# Patient Record
Sex: Female | Born: 1945 | Race: White | Hispanic: No | Marital: Single | Smoking: Never smoker
Health system: Southern US, Community
[De-identification: ages and names within clinical notes are randomized; demographics above are authoritative.]

## PROBLEM LIST (undated history)

## (undated) DIAGNOSIS — Z7189 Other specified counseling: Secondary | ICD-10-CM

## (undated) DIAGNOSIS — F329 Major depressive disorder, single episode, unspecified: Secondary | ICD-10-CM

## (undated) DIAGNOSIS — C4362 Malignant melanoma of left upper limb, including shoulder: Secondary | ICD-10-CM

## (undated) DIAGNOSIS — F32A Depression, unspecified: Secondary | ICD-10-CM

## (undated) DIAGNOSIS — I10 Essential (primary) hypertension: Secondary | ICD-10-CM

## (undated) HISTORY — DX: Malignant melanoma of left upper limb, including shoulder: C43.62

## (undated) HISTORY — DX: Other specified counseling: Z71.89

---
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
## 2017-12-02 ENCOUNTER — Inpatient Hospital Stay: Payer: Medicare HMO

## 2017-12-02 ENCOUNTER — Other Ambulatory Visit: Payer: Self-pay

## 2017-12-02 ENCOUNTER — Inpatient Hospital Stay: Payer: Medicare HMO | Attending: Hematology & Oncology | Admitting: Hematology & Oncology

## 2017-12-02 ENCOUNTER — Encounter: Payer: Self-pay | Admitting: Hematology & Oncology

## 2017-12-02 VITALS — BP 160/75 | HR 83 | Temp 98.2°F | Resp 16 | Ht 66.5 in | Wt 257.0 lb

## 2017-12-02 DIAGNOSIS — C792 Secondary malignant neoplasm of skin: Secondary | ICD-10-CM | POA: Diagnosis not present

## 2017-12-02 DIAGNOSIS — Z7189 Other specified counseling: Secondary | ICD-10-CM

## 2017-12-02 DIAGNOSIS — C4362 Malignant melanoma of left upper limb, including shoulder: Secondary | ICD-10-CM | POA: Diagnosis not present

## 2017-12-02 DIAGNOSIS — Z85038 Personal history of other malignant neoplasm of large intestine: Secondary | ICD-10-CM | POA: Insufficient documentation

## 2017-12-02 DIAGNOSIS — Z5112 Encounter for antineoplastic immunotherapy: Secondary | ICD-10-CM | POA: Diagnosis present

## 2017-12-02 HISTORY — DX: Malignant melanoma of left upper limb, including shoulder: C43.62

## 2017-12-02 HISTORY — DX: Other specified counseling: Z71.89

## 2017-12-02 LAB — CMP (CANCER CENTER ONLY)
ALT: 15 U/L (ref 0–55)
AST: 23 U/L (ref 5–34)
Albumin: 3.9 g/dL (ref 3.5–5.0)
Alkaline Phosphatase: 75 U/L (ref 40–150)
Anion gap: 10 (ref 3–11)
BUN: 20 mg/dL (ref 7–26)
CHLORIDE: 106 mmol/L (ref 98–109)
CO2: 24 mmol/L (ref 22–29)
CREATININE: 0.97 mg/dL (ref 0.60–1.10)
Calcium: 9.7 mg/dL (ref 8.4–10.4)
GFR, EST NON AFRICAN AMERICAN: 57 mL/min — AB (ref >60)
GFR, Est AFR Am: >60 mL/min (ref >60)
GLUCOSE: 91 mg/dL (ref 70–140)
Potassium: 4.2 mmol/L (ref 3.5–5.1)
Sodium: 140 mmol/L (ref 136–145)
Total Bilirubin: 0.4 mg/dL (ref 0.2–1.2)
Total Protein: 7.5 g/dL (ref 6.4–8.3)

## 2017-12-02 LAB — CBC WITH DIFFERENTIAL (CANCER CENTER ONLY)
Basophils Absolute: 0 10*3/uL (ref 0.0–0.1)
Basophils Relative: 0 %
EOS ABS: 0.2 10*3/uL (ref 0.0–0.5)
EOS PCT: 2 %
HCT: 38.7 % (ref 34.8–46.6)
Hemoglobin: 12.7 g/dL (ref 11.6–15.9)
LYMPHS ABS: 3.8 10*3/uL — AB (ref 0.9–3.3)
Lymphocytes Relative: 38 %
MCH: 27.2 pg (ref 26.0–34.0)
MCHC: 32.8 g/dL (ref 32.0–36.0)
MCV: 82.9 fL (ref 81.0–101.0)
MONO ABS: 0.8 10*3/uL (ref 0.1–0.9)
MONOS PCT: 8 %
Neutro Abs: 5.1 10*3/uL (ref 1.5–6.5)
Neutrophils Relative %: 52 %
PLATELETS: 273 10*3/uL (ref 145–400)
RBC: 4.67 MIL/uL (ref 3.70–5.32)
RDW: 15.3 % (ref 11.1–15.7)
WBC: 9.9 10*3/uL (ref 3.9–10.0)

## 2017-12-02 LAB — LACTATE DEHYDROGENASE: LDH: 188 U/L (ref 125–245)

## 2017-12-02 NOTE — Progress Notes (Signed)
START ON PATHWAY REGIMEN - Melanoma     A cycle is every 28 days:     Nivolumab   **Always confirm dose/schedule in your pharmacy ordering system**  Patient Characteristics: Stage III, Resected, BRAF V600 Wild Type / BRAF V600 Results Pending or Unknown Disease Subtype: Cutaneous Current Disease Status: No Recurrence and No Distant Metastases AJCC 8 Stage Grouping: III AJCC T Category: cT3 AJCC N Category: N1 AJCC M Category: M0 Mutation Status: Awaiting BRAF V600 Results Intent of Therapy: Curative Intent, Discussed with Patient

## 2017-12-02 NOTE — Progress Notes (Signed)
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 New Patient (Initial Visit)  : I had melanoma taken off.  HPI: Brittany Williamson is a very charming 72 year old white female.  She will be 72 and 11 days.  She has been in decent health.  She did have her gallbladder taken out many years ago.  She used to work for [employer] of [place].  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  She had noted a lesion on her left shoulder.  This appeared to be getting larger and darker over several months.  She has seen a dermatologist who was not too worried.  As it kept growing, she went to see a different dermatologist.  She subsequently had a biopsy done.  This was done in April.  The pathology report showed a melanoma that was 9 mm in size.  I think it had a breast low depth of 2.88 mm.  She was then referred to Dr. Zeb Comfort.  He went ahead and did a wide local excision and a left axillary node excision.  This was done on Nov 05, 2017.  The pathology report ([id number]) showed no residual melanoma.  1 of 2 sentinel lymph nodes were positive.  As such, she has a stage IIIa melanoma.  No other studies have been done as of yet.  She was referred to the [facility] and evaluation for adjuvant therapy.  She is quite nervous.  She comes in with her husband.  He had colon cancer that was resected and treated with adjuvant therapy about 7 years ago.  She does not know of any sunburns.  She has been to tanning beds but has not been for quite a while.  She does not smoke.  She really does not drink alcohol.  No one in her family has any skin cancer.  There is been no fever.  She is had no cough.  She said no swelling in her arms.  She has had no change in bowel or bladder habits.  Her last colonoscopy was about 5-6 years ago.  She gets her yearly mammograms.  Currently, her performance status is ECOG  0.   Past Medical History:  Diagnosis Date  . Goals of care, counseling/discussion 12/02/2017  . Malignant melanoma of arm, left ([facility]) 12/02/2017  :  History reviewed. No pertinent surgical history.:   Current Outpatient Medications:  .  atorvastatin (LIPITOR) 20 MG tablet, Take 20 mg by mouth daily., Disp: , Rfl:  .  carvedilol (COREG) 3.125 MG tablet, Take 3.125 mg by mouth 2 (two) times daily., Disp: , Rfl:  .  valsartan-hydrochlorothiazide (DIOVAN-HCT) 80-12.5 MG tablet, , Disp: , Rfl:  .  DULoxetine (CYMBALTA) 30 MG capsule, Take 30 mg by mouth daily., Disp: , Rfl: :  :  Allergies  Allergen Reactions  . Bee Venom Other (See Comments)  . Penicillins Rash  . Beeswax Other (See Comments)    Bee stings. Bee stings.   :  History reviewed. No pertinent family history.:  Social History   Socioeconomic History  . Marital status: Unknown    Spouse name: Not on file  . Number of children: Not on file  . Years of education: Not on file  . Highest education level: Not on file  Occupational History  . Not on file  Social Needs  . Financial resource strain: Not on file  . Food  insecurity:    Worry: Not on file    Inability: Not on file  . Transportation needs:    Medical: Not on file    Non-medical: Not on file  Tobacco Use  . Smoking status: Not on file  Substance and Sexual Activity  . Alcohol use: Not on file  . Drug use: Not on file  . Sexual activity: Not on file  Lifestyle  . Physical activity:    Days per week: Not on file    Minutes per session: Not on file  . Stress: Not on file  Relationships  . Social connections:    Talks on phone: Not on file    Gets together: Not on file    Attends religious service: Not on file    Active member of club or organization: Not on file    Attends meetings of clubs or organizations: Not on file    Relationship status: Not on file  . Intimate partner violence:    Fear of current or ex partner: Not on file     Emotionally abused: Not on file    Physically abused: Not on file    Forced sexual activity: Not on file  Other Topics Concern  . Not on file  Social History Narrative  . Not on file  :  Review of Systems  Constitutional: Negative.   HENT: Negative.   Eyes: Negative.   Respiratory: Negative.   Cardiovascular: Negative.   Gastrointestinal: Negative.   Genitourinary: Negative.   Musculoskeletal: Negative.   Skin: Negative.   Neurological: Negative.   Endo/Heme/Allergies: Negative.   Psychiatric/Behavioral: Negative.      Exam: Moderately obese white female in no obvious distress.  Her vital signs show a temperature of 98.2.  Pulse 83.  Blood pressure 160/75.  Weight is 257 pounds.  Head neck exam shows no ocular or oral lesions.  There are no palpable cervical or supraclavicular lymph nodes.  Lungs are clear bilaterally.  Cardiac exam regular rate and rhythm with no murmurs, rubs or bruits.  Abdomen is soft.  She has good bowel sounds.  There is no fluid wave.  There is no palpable liver or spleen tip.  She has laparoscopy scars from her cholecystectomy.  Back exam shows no tenderness over the spine, ribs or hips.  Extremities shows a wide local excision scar in the left anterior shoulder.  This is healing.  Axillary exam shows no bilateral axillary adenopathy.  She has the healing left sentinel node axillary lymphadenectomy scar.  Lower extremities shows no clubbing, cyanosis or edema.  Skin exam shows no rashes, ecchymoses or petechia.  No suspicious lesions are noted on her back.  Neurological exam shows no focal neurological deficits. _0 @   Recent Labs    12/02/17 1314  WBC 9.9  HGB 12.7  HCT 38.7  PLT 273   Recent Labs    12/02/17 1314  NA 140  K 4.2  CL 106  CO2 24  GLUCOSE 91  BUN 20  CREATININE 0.97  CALCIUM 9.7    Blood smear review: None  Pathology: See above    Assessment and Plan: Brittany Williamson is a very charming 72 year old white female with a  stage IIIa melanoma of the left shoulder.  This was resected.  She had one positive sentinel lymph node.  Brittany Williamson is in very good shape.  As such, she would be a great candidate for adjuvant immunotherapy.  I spent a good hour with she and her husband.  All the time was spent face-to-face with them.  I explained why adjuvant immunotherapy is recommended.  I think without any therapy, she has a significant risk of recurrence of this melanoma.  I do think we should get a PET scan just to make sure that there is no obvious metastatic disease.  We will use nivolumab.  We will treat her monthly.  I think with monthly nivolumab, we can avoid having to have a Port-A-Cath placed.  I did will need to get a BRAF analysis on her melanoma.  I do not think this was done as of yet.  I think would be helpful to get one.  Brittany Williamson has  to attend to some events that she had already paid for.  She has a convention that she has to go to.  Her birthday then comes up in a couple weeks.  We will get started with treatment at the end of June.  She will need 1 year of adjuvant therapy.  I went over the side effects of treatment.  I explained to her about diarrhea, skin rash, abnormal liver function test, low thyroid function, etc., she understands all this.  She agrees to proceed with therapy.  I answered all of her questions.  She is obviously very well versed and very knowledgeable.  It was fun talking with she and her husband.  I will plan to have him come back for the second cycle of treatment in July.

## 2017-12-03 ENCOUNTER — Telehealth: Payer: Self-pay | Admitting: *Deleted

## 2017-12-03 NOTE — Telephone Encounter (Signed)
Written request faxed to:  [facility] [phone]  Request for BRAF to be added to  Case [id number] Specimen #1 Collected 11/05/2017  Faxed receipt confirmed transmission

## 2017-12-16 ENCOUNTER — Telehealth: Payer: Self-pay | Admitting: *Deleted

## 2017-12-16 ENCOUNTER — Other Ambulatory Visit: Payer: Self-pay | Admitting: Hematology & Oncology

## 2017-12-16 ENCOUNTER — Encounter (HOSPITAL_COMMUNITY)
Admission: RE | Admit: 2017-12-16 | Discharge: 2017-12-16 | Disposition: A | Discharge status: Home / Self Care | Payer: Medicare HMO | Source: Ambulatory Visit | Attending: Hematology & Oncology | Admitting: Hematology & Oncology

## 2017-12-16 ENCOUNTER — Inpatient Hospital Stay: Payer: Medicare HMO

## 2017-12-16 DIAGNOSIS — C4362 Malignant melanoma of left upper limb, including shoulder: Secondary | ICD-10-CM | POA: Diagnosis not present

## 2017-12-16 LAB — GLUCOSE, CAPILLARY: Glucose-Capillary: 113 mg/dL — ABNORMAL HIGH (ref 65–99)

## 2017-12-16 MED ORDER — FLUDEOXYGLUCOSE F - 18 (FDG) INJECTION
12.9000 | Freq: Once | INTRAVENOUS | Status: AC | PRN
  Administered 2017-12-16: 12.9 via INTRAVENOUS

## 2017-12-16 NOTE — Telephone Encounter (Addendum)
Patient is aware of results  ----- Message from Volanda Napoleon, MD sent at 12/16/2017  1:24 PM EDT ----- Call - NO obvious melanoma elsewhere!!  [name]

## 2017-12-20 ENCOUNTER — Other Ambulatory Visit: Payer: Self-pay | Admitting: *Deleted

## 2017-12-20 DIAGNOSIS — C4362 Malignant melanoma of left upper limb, including shoulder: Secondary | ICD-10-CM

## 2017-12-23 ENCOUNTER — Inpatient Hospital Stay: Payer: Medicare HMO

## 2017-12-23 VITALS — BP 148/54 | HR 72 | Temp 97.6°F | Resp 18

## 2017-12-23 DIAGNOSIS — C4362 Malignant melanoma of left upper limb, including shoulder: Secondary | ICD-10-CM

## 2017-12-23 DIAGNOSIS — Z5112 Encounter for antineoplastic immunotherapy: Secondary | ICD-10-CM | POA: Diagnosis not present

## 2017-12-23 LAB — CBC WITH DIFFERENTIAL (CANCER CENTER ONLY)
Basophils Absolute: 0 10*3/uL (ref 0.0–0.1)
Basophils Relative: 0 %
EOS ABS: 0.1 10*3/uL (ref 0.0–0.5)
Eosinophils Relative: 2 %
HCT: 38.1 % (ref 34.8–46.6)
HEMOGLOBIN: 12.5 g/dL (ref 11.6–15.9)
LYMPHS ABS: 2.9 10*3/uL (ref 0.9–3.3)
Lymphocytes Relative: 41 %
MCH: 27.2 pg (ref 26.0–34.0)
MCHC: 32.8 g/dL (ref 32.0–36.0)
MCV: 83 fL (ref 81.0–101.0)
MONO ABS: 0.5 10*3/uL (ref 0.1–0.9)
Monocytes Relative: 7 %
Neutro Abs: 3.6 10*3/uL (ref 1.5–6.5)
Neutrophils Relative %: 50 %
Platelet Count: 261 10*3/uL (ref 145–400)
RBC: 4.59 MIL/uL (ref 3.70–5.32)
RDW: 15.1 % (ref 11.1–15.7)
WBC Count: 7.1 10*3/uL (ref 3.9–10.0)

## 2017-12-23 LAB — CMP (CANCER CENTER ONLY)
ALK PHOS: 72 U/L (ref 26–84)
ALT: 29 U/L (ref 10–47)
AST: 24 U/L (ref 11–38)
Albumin: 3.6 g/dL (ref 3.5–5.0)
Anion gap: 12 (ref 5–15)
BUN: 19 mg/dL (ref 7–22)
CHLORIDE: 106 mmol/L (ref 98–108)
CO2: 25 mmol/L (ref 18–33)
Calcium: 9.2 mg/dL (ref 8.0–10.3)
Creatinine: 1 mg/dL (ref 0.60–1.20)
Glucose, Bld: 111 mg/dL (ref 73–118)
POTASSIUM: 3.8 mmol/L (ref 3.3–4.7)
SODIUM: 143 mmol/L (ref 128–145)
Total Bilirubin: 0.7 mg/dL (ref 0.2–1.6)
Total Protein: 7.2 g/dL (ref 6.4–8.1)

## 2017-12-23 MED ORDER — ACETAMINOPHEN 325 MG PO TABS
ORAL_TABLET | ORAL | Status: AC
  Filled 2017-12-23: qty 2

## 2017-12-23 MED ORDER — SODIUM CHLORIDE 0.9 % IV SOLN
480.0000 mg | Freq: Once | INTRAVENOUS | Status: AC
  Administered 2017-12-23: 480 mg via INTRAVENOUS
  Filled 2017-12-23: qty 48

## 2017-12-23 MED ORDER — SODIUM CHLORIDE 0.9 % IV SOLN
Freq: Once | INTRAVENOUS | Status: AC
  Administered 2017-12-23: 10:00:00 via INTRAVENOUS

## 2017-12-23 NOTE — Patient Instructions (Signed)
Nivolumab injection What is this medicine? NIVOLUMAB (nye VOL ue mab) is a monoclonal antibody. It is used to treat melanoma, lung cancer, kidney cancer, head and neck cancer, Hodgkin lymphoma, urothelial cancer, colon cancer, and liver cancer. This medicine may be used for other purposes; ask your health care provider or pharmacist if you have questions. COMMON BRAND NAME(S): Opdivo What should I tell my health care provider before I take this medicine? They need to know if you have any of these conditions: -diabetes -immune system problems -kidney disease -liver disease -lung disease -organ transplant -stomach or intestine problems -thyroid disease -an unusual or allergic reaction to nivolumab, other medicines, foods, dyes, or preservatives -pregnant or trying to get pregnant -breast-feeding How should I use this medicine? This medicine is for infusion into a vein. It is given by a health care professional in a hospital or clinic setting. A special MedGuide will be given to you before each treatment. Be sure to read this information carefully each time. Talk to your pediatrician regarding the use of this medicine in children. While this drug may be prescribed for children as young as 12 years for selected conditions, precautions do apply. Overdosage: If you think you have taken too much of this medicine contact a poison control center or emergency room at once. NOTE: This medicine is only for you. Do not share this medicine with others. What if I miss a dose? It is important not to miss your dose. Call your doctor or health care professional if you are unable to keep an appointment. What may interact with this medicine? Interactions have not been studied. Give your health care provider a list of all the medicines, herbs, non-prescription drugs, or dietary supplements you use. Also tell them if you smoke, drink alcohol, or use illegal drugs. Some items may interact with your  medicine. This list may not describe all possible interactions. Give your health care provider a list of all the medicines, herbs, non-prescription drugs, or dietary supplements you use. Also tell them if you smoke, drink alcohol, or use illegal drugs. Some items may interact with your medicine. What should I watch for while using this medicine? This drug may make you feel generally unwell. Continue your course of treatment even though you feel ill unless your doctor tells you to stop. You may need blood work done while you are taking this medicine. Do not become pregnant while taking this medicine or for 5 months after stopping it. Women should inform their doctor if they wish to become pregnant or think they might be pregnant. There is a potential for serious side effects to an unborn child. Talk to your health care professional or pharmacist for more information. Do not breast-feed an infant while taking this medicine. What side effects may I notice from receiving this medicine? Side effects that you should report to your doctor or health care professional as soon as possible: -allergic reactions like skin rash, itching or hives, swelling of the face, lips, or tongue -black, tarry stools -blood in the urine -bloody or watery diarrhea -changes in vision -change in sex drive -changes in emotions or moods -chest pain -confusion -cough -decreased appetite -diarrhea -facial flushing -feeling faint or lightheaded -fever, chills -hair loss -hallucination, loss of contact with reality -headache -irritable -joint pain -loss of memory -muscle pain -muscle weakness -seizures -shortness of breath -signs and symptoms of high blood sugar such as dizziness; dry mouth; dry skin; fruity breath; nausea; stomach pain; increased hunger or thirst; increased   urination -signs and symptoms of kidney injury like trouble passing urine or change in the amount of urine -signs and symptoms of liver injury  like dark yellow or brown urine; general ill feeling or flu-like symptoms; light-colored stools; loss of appetite; nausea; right upper belly pain; unusually weak or tired; yellowing of the eyes or skin -stiff neck -swelling of the ankles, feet, hands -weight gain Side effects that usually do not require medical attention (report to your doctor or health care professional if they continue or are bothersome): -bone pain -constipation -tiredness -vomiting This list may not describe all possible side effects. Call your doctor for medical advice about side effects. You may report side effects to FDA at 1-800-FDA-1088. Where should I keep my medicine? This drug is given in a hospital or clinic and will not be stored at home. NOTE: This sheet is a summary. It may not cover all possible information. If you have questions about this medicine, talk to your doctor, pharmacist, or health care provider.  2018 Elsevier/Gold Standard (2016-03-26 17:49:34)  

## 2018-01-16 ENCOUNTER — Other Ambulatory Visit: Payer: Self-pay

## 2018-01-16 DIAGNOSIS — C4362 Malignant melanoma of left upper limb, including shoulder: Secondary | ICD-10-CM

## 2018-01-17 ENCOUNTER — Inpatient Hospital Stay: Payer: Medicare HMO | Attending: Hematology & Oncology

## 2018-01-17 ENCOUNTER — Other Ambulatory Visit: Payer: Medicare HMO

## 2018-01-17 DIAGNOSIS — C4362 Malignant melanoma of left upper limb, including shoulder: Secondary | ICD-10-CM | POA: Insufficient documentation

## 2018-01-17 DIAGNOSIS — Z79899 Other long term (current) drug therapy: Secondary | ICD-10-CM | POA: Diagnosis not present

## 2018-01-17 DIAGNOSIS — C792 Secondary malignant neoplasm of skin: Secondary | ICD-10-CM | POA: Insufficient documentation

## 2018-01-17 DIAGNOSIS — Z5112 Encounter for antineoplastic immunotherapy: Secondary | ICD-10-CM | POA: Diagnosis present

## 2018-01-17 LAB — CBC WITH DIFFERENTIAL (CANCER CENTER ONLY)
Basophils Absolute: 0 10*3/uL (ref 0.0–0.1)
Basophils Relative: 0 %
EOS ABS: 0.2 10*3/uL (ref 0.0–0.5)
EOS PCT: 2 %
HCT: 39.8 % (ref 34.8–46.6)
Hemoglobin: 13.1 g/dL (ref 11.6–15.9)
LYMPHS ABS: 2.9 10*3/uL (ref 0.9–3.3)
LYMPHS PCT: 40 %
MCH: 27 pg (ref 26.0–34.0)
MCHC: 32.9 g/dL (ref 32.0–36.0)
MCV: 82.1 fL (ref 81.0–101.0)
MONO ABS: 0.6 10*3/uL (ref 0.1–0.9)
Monocytes Relative: 8 %
Neutro Abs: 3.5 10*3/uL (ref 1.5–6.5)
Neutrophils Relative %: 50 %
PLATELETS: 245 10*3/uL (ref 145–400)
RBC: 4.85 MIL/uL (ref 3.70–5.32)
RDW: 14.9 % (ref 11.1–15.7)
WBC Count: 7.2 10*3/uL (ref 3.9–10.0)

## 2018-01-17 LAB — CMP (CANCER CENTER ONLY)
ALT: 18 U/L (ref 0–44)
ANION GAP: 11 (ref 5–15)
AST: 18 U/L (ref 15–41)
Albumin: 4 g/dL (ref 3.5–5.0)
Alkaline Phosphatase: 77 U/L (ref 38–126)
BUN: 20 mg/dL (ref 8–23)
CHLORIDE: 105 mmol/L (ref 98–111)
CO2: 23 mmol/L (ref 22–32)
Calcium: 9.5 mg/dL (ref 8.9–10.3)
Creatinine: 0.8 mg/dL (ref 0.44–1.00)
Glucose, Bld: 120 mg/dL — ABNORMAL HIGH (ref 70–99)
POTASSIUM: 3.9 mmol/L (ref 3.5–5.1)
SODIUM: 139 mmol/L (ref 135–145)
Total Bilirubin: 0.4 mg/dL (ref 0.3–1.2)
Total Protein: 7.6 g/dL (ref 6.5–8.1)

## 2018-01-17 LAB — TSH: TSH: 2.113 u[IU]/mL (ref 0.308–3.960)

## 2018-01-17 LAB — LACTATE DEHYDROGENASE: LDH: 194 U/L — ABNORMAL HIGH (ref 98–192)

## 2018-01-20 ENCOUNTER — Other Ambulatory Visit: Payer: Medicare HMO

## 2018-01-20 ENCOUNTER — Inpatient Hospital Stay: Payer: Medicare HMO

## 2018-01-20 ENCOUNTER — Encounter: Payer: Self-pay | Admitting: Family

## 2018-01-20 ENCOUNTER — Inpatient Hospital Stay: Payer: Medicare HMO | Admitting: Family

## 2018-01-20 ENCOUNTER — Other Ambulatory Visit: Payer: Self-pay

## 2018-01-20 VITALS — BP 156/62 | HR 87 | Temp 98.0°F | Resp 19 | Wt 254.0 lb

## 2018-01-20 DIAGNOSIS — C4362 Malignant melanoma of left upper limb, including shoulder: Secondary | ICD-10-CM

## 2018-01-20 DIAGNOSIS — C792 Secondary malignant neoplasm of skin: Secondary | ICD-10-CM | POA: Diagnosis not present

## 2018-01-20 DIAGNOSIS — R350 Frequency of micturition: Secondary | ICD-10-CM | POA: Diagnosis not present

## 2018-01-20 DIAGNOSIS — R21 Rash and other nonspecific skin eruption: Secondary | ICD-10-CM

## 2018-01-20 DIAGNOSIS — R32 Unspecified urinary incontinence: Secondary | ICD-10-CM | POA: Diagnosis not present

## 2018-01-20 DIAGNOSIS — Z5112 Encounter for antineoplastic immunotherapy: Secondary | ICD-10-CM | POA: Diagnosis not present

## 2018-01-20 DIAGNOSIS — E032 Hypothyroidism due to medicaments and other exogenous substances: Secondary | ICD-10-CM

## 2018-01-20 MED ORDER — SODIUM CHLORIDE 0.9 % IV SOLN
480.0000 mg | Freq: Once | INTRAVENOUS | Status: AC
  Administered 2018-01-20: 480 mg via INTRAVENOUS
  Filled 2018-01-20: qty 48

## 2018-01-20 MED ORDER — SODIUM CHLORIDE 0.9 % IV SOLN
Freq: Once | INTRAVENOUS | Status: AC
  Administered 2018-01-20: 11:00:00 via INTRAVENOUS

## 2018-01-20 NOTE — Progress Notes (Signed)
Hematology and Oncology Follow Up Visit  Brittany Williamson [id number] 1945-09-18 72 y.o. 01/20/2018   Principle Diagnosis:  Stage IIIa (T2N1M0) nodular melanoma of the left shoulder  Current Therapy:   Wide local excision and left axillary node excision on 11/05/2017 Opdivo q 28 days s/p cycle 1   Interim History:  Brittany Williamson is here today for follow-up and cycle 2 of Opdivo. She tolerated her first cycle well but has noticed some fatigue. Her left shoulder and left axillary incision sites have healed nicely. No redness, edema or drainage to indicate infection.  No fever, chills, n/v, cough, dizziness, SOB, chest pain, palpitations or abdominal pain.  She had a few episodes of loose stools after her first cycle. She describes this as tolerable.  The puffiness in her feet and ankles waxes and wanes. No numbness or tingling in her extremities.  She will occasionally itch all over but has not seen a rash. She takes allergy medicine and it clears up.  No bleeding, bruising or petechiae. No lymphadenopathy noted on her exam.  She has a good appetite and is staying well hydrated. Her weight is stable.  Her legs have felt weak and she uses a cane or walker as needed for support when ambulating. She has not had any falls or syncopal episodes.  She states that due to hydrating well she now has urinary urgency/frequency and incontinence at times. She is wearing adult diapers and has noted some diaper rash.   ECOG Performance Status: 1 - Symptomatic but completely ambulatory  Medications:  Allergies as of 01/20/2018      Reactions   Bee Venom Other (See Comments)   Penicillins Rash   Beeswax Other (See Comments)   Bee stings. Bee stings.      Medication List        Accurate as of 01/20/18 10:04 AM. Always use your most recent med list.          atorvastatin 20 MG tablet Commonly known as:  LIPITOR Take 20 mg by mouth daily.   carvedilol 3.125 MG tablet Commonly known as:  COREG Take 3.125  mg by mouth 2 (two) times daily.   DULoxetine 30 MG capsule Commonly known as:  CYMBALTA Take 30 mg by mouth daily.   valsartan-hydrochlorothiazide 80-12.5 MG tablet Commonly known as:  DIOVAN-HCT       Allergies:  Allergies  Allergen Reactions  . Bee Venom Other (See Comments)  . Penicillins Rash  . Beeswax Other (See Comments)    Bee stings. Bee stings.     Past Medical History, Surgical history, Social history, and Family History were reviewed and updated.  Review of Systems: All other 10 point review of systems is negative.   Physical Exam:  vitals were not taken for this visit.   Wt Readings from Last 3 Encounters:  12/02/17 257 lb (116.6 kg)    Ocular: Sclerae unicteric, pupils equal, round and reactive to light Ear-nose-throat: Oropharynx clear, dentition fair Lymphatic: No cervical, supraclavicular or axillary adenopathy Lungs no rales or rhonchi, good excursion bilaterally Heart regular rate and rhythm, no murmur appreciated Abd soft, nontender, positive bowel sounds, no liver or spleen tip palpated on exam, no fluid wave MSK no focal spinal tenderness, no joint edema Neuro: non-focal, well-oriented, appropriate affect Breasts: Deferred   Lab Results  Component Value Date   WBC 7.2 01/17/2018   HGB 13.1 01/17/2018   HCT 39.8 01/17/2018   MCV 82.1 01/17/2018   PLT 245 01/17/2018   No  results found for: FERRITIN, IRON, TIBC, UIBC, IRONPCTSAT Lab Results  Component Value Date   RBC 4.85 01/17/2018   No results found for: KPAFRELGTCHN, LAMBDASER, KAPLAMBRATIO No results found for: IGGSERUM, IGA, IGMSERUM No results found for: Ronnald Ramp, A1GS, A2GS, Tillman Sers, SPEI   Chemistry      Component Value Date/Time   NA 139 01/17/2018 0745   K 3.9 01/17/2018 0745   CL 105 01/17/2018 0745   CO2 23 01/17/2018 0745   BUN 20 01/17/2018 0745   CREATININE 0.80 01/17/2018 0745      Component Value Date/Time   CALCIUM 9.5  01/17/2018 0745   ALKPHOS 77 01/17/2018 0745   AST 18 01/17/2018 0745   ALT 18 01/17/2018 0745   BILITOT 0.4 01/17/2018 0745      Impression and Plan: Brittany Williamson is a very pleasant 72 yo caucasian female with 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  She a wide local excision and left axillary node excision on 11/05/2017 and has completed her first cycle of Opdivo. She did well with treatment and has noted minimal side effects. TSH last week was stable.  We will proceed with cycle 2 today as planned.  We will have her try nystatin cream for rash from urinary frequency and incontinence.  We will plan to see her back in another month for follow-up on 02/14/18.  She will contact our office with any questions or concerns. We can certainly see her sooner if need be.   Laverna Peace, NP 7/22/201910:04 AM

## 2018-01-20 NOTE — Patient Instructions (Signed)
Nivolumab injection What is this medicine? NIVOLUMAB (nye VOL ue mab) is a monoclonal antibody. It is used to treat melanoma, lung cancer, kidney cancer, head and neck cancer, Hodgkin lymphoma, urothelial cancer, colon cancer, and liver cancer. This medicine may be used for other purposes; ask your health care provider or pharmacist if you have questions. COMMON BRAND NAME(S): Opdivo What should I tell my health care provider before I take this medicine? They need to know if you have any of these conditions: -diabetes -immune system problems -kidney disease -liver disease -lung disease -organ transplant -stomach or intestine problems -thyroid disease -an unusual or allergic reaction to nivolumab, other medicines, foods, dyes, or preservatives -pregnant or trying to get pregnant -breast-feeding How should I use this medicine? This medicine is for infusion into a vein. It is given by a health care professional in a hospital or clinic setting. A special MedGuide will be given to you before each treatment. Be sure to read this information carefully each time. Talk to your pediatrician regarding the use of this medicine in children. While this drug may be prescribed for children as young as 12 years for selected conditions, precautions do apply. Overdosage: If you think you have taken too much of this medicine contact a poison control center or emergency room at once. NOTE: This medicine is only for you. Do not share this medicine with others. What if I miss a dose? It is important not to miss your dose. Call your doctor or health care professional if you are unable to keep an appointment. What may interact with this medicine? Interactions have not been studied. Give your health care provider a list of all the medicines, herbs, non-prescription drugs, or dietary supplements you use. Also tell them if you smoke, drink alcohol, or use illegal drugs. Some items may interact with your  medicine. This list may not describe all possible interactions. Give your health care provider a list of all the medicines, herbs, non-prescription drugs, or dietary supplements you use. Also tell them if you smoke, drink alcohol, or use illegal drugs. Some items may interact with your medicine. What should I watch for while using this medicine? This drug may make you feel generally unwell. Continue your course of treatment even though you feel ill unless your doctor tells you to stop. You may need blood work done while you are taking this medicine. Do not become pregnant while taking this medicine or for 5 months after stopping it. Women should inform their doctor if they wish to become pregnant or think they might be pregnant. There is a potential for serious side effects to an unborn child. Talk to your health care professional or pharmacist for more information. Do not breast-feed an infant while taking this medicine. What side effects may I notice from receiving this medicine? Side effects that you should report to your doctor or health care professional as soon as possible: -allergic reactions like skin rash, itching or hives, swelling of the face, lips, or tongue -black, tarry stools -blood in the urine -bloody or watery diarrhea -changes in vision -change in sex drive -changes in emotions or moods -chest pain -confusion -cough -decreased appetite -diarrhea -facial flushing -feeling faint or lightheaded -fever, chills -hair loss -hallucination, loss of contact with reality -headache -irritable -joint pain -loss of memory -muscle pain -muscle weakness -seizures -shortness of breath -signs and symptoms of high blood sugar such as dizziness; dry mouth; dry skin; fruity breath; nausea; stomach pain; increased hunger or thirst; increased   urination -signs and symptoms of kidney injury like trouble passing urine or change in the amount of urine -signs and symptoms of liver injury  like dark yellow or brown urine; general ill feeling or flu-like symptoms; light-colored stools; loss of appetite; nausea; right upper belly pain; unusually weak or tired; yellowing of the eyes or skin -stiff neck -swelling of the ankles, feet, hands -weight gain Side effects that usually do not require medical attention (report to your doctor or health care professional if they continue or are bothersome): -bone pain -constipation -tiredness -vomiting This list may not describe all possible side effects. Call your doctor for medical advice about side effects. You may report side effects to FDA at 1-800-FDA-1088. Where should I keep my medicine? This drug is given in a hospital or clinic and will not be stored at home. NOTE: This sheet is a summary. It may not cover all possible information. If you have questions about this medicine, talk to your doctor, pharmacist, or health care provider.  2018 Elsevier/Gold Standard (2016-03-26 17:49:34)  

## 2018-01-21 ENCOUNTER — Other Ambulatory Visit: Payer: Self-pay | Admitting: Family

## 2018-01-21 DIAGNOSIS — C4362 Malignant melanoma of left upper limb, including shoulder: Secondary | ICD-10-CM

## 2018-01-21 DIAGNOSIS — B3789 Other sites of candidiasis: Secondary | ICD-10-CM

## 2018-01-21 DIAGNOSIS — B3731 Acute candidiasis of vulva and vagina: Secondary | ICD-10-CM

## 2018-01-21 DIAGNOSIS — B373 Candidiasis of vulva and vagina: Secondary | ICD-10-CM

## 2018-01-21 MED ORDER — NYSTATIN 100000 UNIT/GM EX CREA: 1.0000 "application " | TOPICAL_CREAM | Freq: Two times a day (BID) | CUTANEOUS | 0 refills | Status: AC

## 2018-02-14 ENCOUNTER — Inpatient Hospital Stay: Payer: Medicare HMO | Attending: Hematology & Oncology

## 2018-02-14 DIAGNOSIS — F329 Major depressive disorder, single episode, unspecified: Secondary | ICD-10-CM | POA: Insufficient documentation

## 2018-02-14 DIAGNOSIS — C4362 Malignant melanoma of left upper limb, including shoulder: Secondary | ICD-10-CM | POA: Insufficient documentation

## 2018-02-14 DIAGNOSIS — C792 Secondary malignant neoplasm of skin: Secondary | ICD-10-CM | POA: Insufficient documentation

## 2018-02-14 DIAGNOSIS — Z5112 Encounter for antineoplastic immunotherapy: Secondary | ICD-10-CM | POA: Insufficient documentation

## 2018-02-14 DIAGNOSIS — Z79899 Other long term (current) drug therapy: Secondary | ICD-10-CM | POA: Insufficient documentation

## 2018-02-14 DIAGNOSIS — E032 Hypothyroidism due to medicaments and other exogenous substances: Secondary | ICD-10-CM

## 2018-02-14 LAB — CBC WITH DIFFERENTIAL (CANCER CENTER ONLY)
BASOS PCT: 0 %
Basophils Absolute: 0 10*3/uL (ref 0.0–0.1)
Eosinophils Absolute: 0.2 10*3/uL (ref 0.0–0.5)
Eosinophils Relative: 2 %
HEMATOCRIT: 39.7 % (ref 34.8–46.6)
Hemoglobin: 13.1 g/dL (ref 11.6–15.9)
Lymphocytes Relative: 38 %
Lymphs Abs: 3.6 10*3/uL — ABNORMAL HIGH (ref 0.9–3.3)
MCH: 27.2 pg (ref 26.0–34.0)
MCHC: 33 g/dL (ref 32.0–36.0)
MCV: 82.5 fL (ref 81.0–101.0)
MONO ABS: 0.7 10*3/uL (ref 0.1–0.9)
MONOS PCT: 7 %
NEUTROS ABS: 4.9 10*3/uL (ref 1.5–6.5)
Neutrophils Relative %: 53 %
Platelet Count: 295 10*3/uL (ref 145–400)
RBC: 4.81 MIL/uL (ref 3.70–5.32)
RDW: 14.9 % (ref 11.1–15.7)
WBC Count: 9.3 10*3/uL (ref 3.9–10.0)

## 2018-02-14 LAB — CMP (CANCER CENTER ONLY)
ALBUMIN: 4 g/dL (ref 3.5–5.0)
ALT: 19 U/L (ref 0–44)
ANION GAP: 10 (ref 5–15)
AST: 20 U/L (ref 15–41)
Alkaline Phosphatase: 94 U/L (ref 38–126)
BILIRUBIN TOTAL: 0.7 mg/dL (ref 0.3–1.2)
BUN: 18 mg/dL (ref 8–23)
CO2: 25 mmol/L (ref 22–32)
Calcium: 9.4 mg/dL (ref 8.9–10.3)
Chloride: 101 mmol/L (ref 98–111)
Creatinine: 0.87 mg/dL (ref 0.44–1.00)
GFR, Est AFR Am: >60 mL/min (ref >60)
GFR, Estimated: >60 mL/min (ref >60)
GLUCOSE: 100 mg/dL — AB (ref 70–99)
Potassium: 3.7 mmol/L (ref 3.5–5.1)
Sodium: 136 mmol/L (ref 135–145)
TOTAL PROTEIN: 7.9 g/dL (ref 6.5–8.1)

## 2018-02-14 LAB — LACTATE DEHYDROGENASE: LDH: 175 U/L (ref 98–192)

## 2018-02-17 ENCOUNTER — Inpatient Hospital Stay: Payer: Medicare HMO

## 2018-02-17 ENCOUNTER — Inpatient Hospital Stay: Payer: Medicare HMO | Admitting: Hematology & Oncology

## 2018-02-17 ENCOUNTER — Other Ambulatory Visit: Payer: Self-pay | Admitting: Family

## 2018-02-17 ENCOUNTER — Other Ambulatory Visit: Payer: Self-pay

## 2018-02-17 VITALS — BP 147/56 | HR 91 | Temp 97.8°F | Resp 18 | Wt 252.2 lb

## 2018-02-17 DIAGNOSIS — C792 Secondary malignant neoplasm of skin: Secondary | ICD-10-CM

## 2018-02-17 DIAGNOSIS — C4362 Malignant melanoma of left upper limb, including shoulder: Secondary | ICD-10-CM | POA: Diagnosis not present

## 2018-02-17 DIAGNOSIS — F329 Major depressive disorder, single episode, unspecified: Secondary | ICD-10-CM | POA: Diagnosis not present

## 2018-02-17 LAB — TSH: TSH: 1.228 u[IU]/mL (ref 0.308–3.960)

## 2018-02-17 MED ORDER — SODIUM CHLORIDE 0.9 % IV SOLN
480.0000 mg | Freq: Once | INTRAVENOUS | Status: AC
  Administered 2018-02-17: 480 mg via INTRAVENOUS
  Filled 2018-02-17: qty 48

## 2018-02-17 MED ORDER — SODIUM CHLORIDE 0.9 % IV SOLN
Freq: Once | INTRAVENOUS | Status: AC
  Administered 2018-02-17: 12:00:00 via INTRAVENOUS
  Filled 2018-02-17: qty 250

## 2018-02-17 NOTE — Progress Notes (Signed)
Hematology and Oncology Follow Up Visit  Brittany Williamson [id number] 09/12/1945 71 y.o. 02/17/2018   Principle Diagnosis:  Stage IIIa (T2N1M0) nodular melanoma of the left shoulder  Current Therapy:   Wide local excision and left axillary node excision on 11/05/2017 Opdivo q 28 days s/p cycle #2   Interim History:  Brittany Williamson is here today for follow-up.  She is quite happy right now.  However, she also has an element of depression.  She has had depression for quite a while.  She is on Cymbalta but only at 30 mg a day.  I told her to try doubling this to see if this can help with her depression.  She is quite excited that she would be going down to [place], [place] this weekend.  Twin granddaughters that she is never seen before live in [place] will be coming down.  She really is looking forward to this.  She is truly blessed and that she has so many grandkids and great grandchildren.  The nivolumab is doing pretty well for her.  She is getting by with out having any diarrhea.  She is having no nausea or vomiting.  She is had no rashes.  She is had no leg swelling.  Overall, her performance status is ECOG 1.    Medications:  Allergies as of 02/17/2018      Reactions   Bee Venom Other (See Comments)   Penicillins Rash   Beeswax Other (See Comments)   Bee stings. Bee stings. Bee stings.      Medication List        Accurate as of 02/17/18 10:45 AM. Always use your most recent med list.          atorvastatin 20 MG tablet Commonly known as:  LIPITOR Take 20 mg by mouth daily.   carvedilol 3.125 MG tablet Commonly known as:  COREG Take 3.125 mg by mouth 2 (two) times daily.   DULoxetine 30 MG capsule Commonly known as:  CYMBALTA Take 30 mg by mouth daily.   nystatin cream Commonly known as:  MYCOSTATIN Apply 1 application topically 2 (two) times daily.   valsartan-hydrochlorothiazide 80-12.5 MG tablet Commonly known as:  DIOVAN-HCT       Allergies:    Allergies  Allergen Reactions  . Bee Venom Other (See Comments)  . Penicillins Rash  . Beeswax Other (See Comments)    Bee stings. Bee stings.  Bee stings.    Past Medical History, Surgical history, Social history, and Family History were reviewed and updated.  Review of Systems: Review of Systems  Constitutional: Negative.   HENT: Negative.   Eyes: Negative.   Respiratory: Negative.   Cardiovascular: Negative.   Gastrointestinal: Negative.   Genitourinary: Negative.   Musculoskeletal: Negative.   Skin: Negative.   Neurological: Negative.   Endo/Heme/Allergies: Negative.   Psychiatric/Behavioral: Positive for depression.     Physical Exam:  weight is 252 lb 4 oz (114.4 kg). Her oral temperature is 97.8 F (36.6 C). Her blood pressure is 147/56 (abnormal) and her pulse is 91. Her respiration is 18 and oxygen saturation is 97%.   Wt Readings from Last 3 Encounters:  02/17/18 252 lb 4 oz (114.4 kg)  01/20/18 254 lb (115.2 kg)  12/02/17 257 lb (116.6 kg)    Physical Exam  Constitutional: She is oriented to person, place, and time.  HENT:  Head: Normocephalic and atraumatic.  Mouth/Throat: Oropharynx is clear and moist.  Eyes: Pupils are equal, round, and reactive to light.  EOM are normal.  Neck: Normal range of motion.  Cardiovascular: Normal rate, regular rhythm and normal heart sounds.  Pulmonary/Chest: Effort normal and breath sounds normal.  Abdominal: Soft. Bowel sounds are normal.  Musculoskeletal: Normal range of motion. She exhibits no edema, tenderness or deformity.  Lymphadenopathy:    She has no cervical adenopathy.  Neurological: She is alert and oriented to person, place, and time.  Skin: Skin is warm and dry. No rash noted. No erythema.  Psychiatric: She has a normal mood and affect. Her behavior is normal. Judgment and thought content normal.  Vitals reviewed.    Lab Results  Component Value Date   WBC 9.3 02/14/2018   HGB 13.1 02/14/2018    HCT 39.7 02/14/2018   MCV 82.5 02/14/2018   PLT 295 02/14/2018   No results found for: FERRITIN, IRON, TIBC, UIBC, IRONPCTSAT Lab Results  Component Value Date   RBC 4.81 02/14/2018   No results found for: KPAFRELGTCHN, LAMBDASER, KAPLAMBRATIO No results found for: IGGSERUM, IGA, IGMSERUM No results found for: Ronnald Ramp, A1GS, A2GS, Tillman Sers, SPEI   Chemistry      Component Value Date/Time   NA 136 02/14/2018 1344   K 3.7 02/14/2018 1344   CL 101 02/14/2018 1344   CO2 25 02/14/2018 1344   BUN 18 02/14/2018 1344   CREATININE 0.87 02/14/2018 1344      Component Value Date/Time   CALCIUM 9.4 02/14/2018 1344   ALKPHOS 94 02/14/2018 1344   AST 20 02/14/2018 1344   ALT 19 02/14/2018 1344   BILITOT 0.7 02/14/2018 1344      Impression and Plan: Brittany Williamson is a very pleasant 72 yo caucasian female with 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   She had a wide local excision and left axillary node excision on 11/05/2017.  We will go ahead with her third cycle of Opdivo.  I think this is reasonable to treat her today.  We are following her TSH.  As far as the depression, hopefully the increase in Cymbalta will help this.  We will plan to get her back in 1 month.    Volanda Napoleon, MD 8/19/201910:45 AM

## 2018-02-17 NOTE — Patient Instructions (Signed)
Nivolumab injection What is this medicine? NIVOLUMAB (nye VOL ue mab) is a monoclonal antibody. It is used to treat melanoma, lung cancer, kidney cancer, head and neck cancer, Hodgkin lymphoma, urothelial cancer, colon cancer, and liver cancer. This medicine may be used for other purposes; ask your health care provider or pharmacist if you have questions. COMMON BRAND NAME(S): Opdivo What should I tell my health care provider before I take this medicine? They need to know if you have any of these conditions: -diabetes -immune system problems -kidney disease -liver disease -lung disease -organ transplant -stomach or intestine problems -thyroid disease -an unusual or allergic reaction to nivolumab, other medicines, foods, dyes, or preservatives -pregnant or trying to get pregnant -breast-feeding How should I use this medicine? This medicine is for infusion into a vein. It is given by a health care professional in a hospital or clinic setting. A special MedGuide will be given to you before each treatment. Be sure to read this information carefully each time. Talk to your pediatrician regarding the use of this medicine in children. While this drug may be prescribed for children as young as 12 years for selected conditions, precautions do apply. Overdosage: If you think you have taken too much of this medicine contact a poison control center or emergency room at once. NOTE: This medicine is only for you. Do not share this medicine with others. What if I miss a dose? It is important not to miss your dose. Call your doctor or health care professional if you are unable to keep an appointment. What may interact with this medicine? Interactions have not been studied. Give your health care provider a list of all the medicines, herbs, non-prescription drugs, or dietary supplements you use. Also tell them if you smoke, drink alcohol, or use illegal drugs. Some items may interact with your  medicine. This list may not describe all possible interactions. Give your health care provider a list of all the medicines, herbs, non-prescription drugs, or dietary supplements you use. Also tell them if you smoke, drink alcohol, or use illegal drugs. Some items may interact with your medicine. What should I watch for while using this medicine? This drug may make you feel generally unwell. Continue your course of treatment even though you feel ill unless your doctor tells you to stop. You may need blood work done while you are taking this medicine. Do not become pregnant while taking this medicine or for 5 months after stopping it. Women should inform their doctor if they wish to become pregnant or think they might be pregnant. There is a potential for serious side effects to an unborn child. Talk to your health care professional or pharmacist for more information. Do not breast-feed an infant while taking this medicine. What side effects may I notice from receiving this medicine? Side effects that you should report to your doctor or health care professional as soon as possible: -allergic reactions like skin rash, itching or hives, swelling of the face, lips, or tongue -black, tarry stools -blood in the urine -bloody or watery diarrhea -changes in vision -change in sex drive -changes in emotions or moods -chest pain -confusion -cough -decreased appetite -diarrhea -facial flushing -feeling faint or lightheaded -fever, chills -hair loss -hallucination, loss of contact with reality -headache -irritable -joint pain -loss of memory -muscle pain -muscle weakness -seizures -shortness of breath -signs and symptoms of high blood sugar such as dizziness; dry mouth; dry skin; fruity breath; nausea; stomach pain; increased hunger or thirst; increased   urination -signs and symptoms of kidney injury like trouble passing urine or change in the amount of urine -signs and symptoms of liver injury  like dark yellow or brown urine; general ill feeling or flu-like symptoms; light-colored stools; loss of appetite; nausea; right upper belly pain; unusually weak or tired; yellowing of the eyes or skin -stiff neck -swelling of the ankles, feet, hands -weight gain Side effects that usually do not require medical attention (report to your doctor or health care professional if they continue or are bothersome): -bone pain -constipation -tiredness -vomiting This list may not describe all possible side effects. Call your doctor for medical advice about side effects. You may report side effects to FDA at 1-800-FDA-1088. Where should I keep my medicine? This drug is given in a hospital or clinic and will not be stored at home. NOTE: This sheet is a summary. It may not cover all possible information. If you have questions about this medicine, talk to your doctor, pharmacist, or health care provider.  2018 Elsevier/Gold Standard (2016-03-26 17:49:34)  

## 2018-03-10 ENCOUNTER — Other Ambulatory Visit: Payer: Self-pay | Admitting: Physician Assistant

## 2018-03-11 ENCOUNTER — Other Ambulatory Visit: Payer: Self-pay | Admitting: Family

## 2018-03-11 ENCOUNTER — Other Ambulatory Visit: Payer: Self-pay

## 2018-03-11 ENCOUNTER — Encounter (HOSPITAL_COMMUNITY): Payer: Self-pay | Admitting: Interventional Radiology

## 2018-03-11 ENCOUNTER — Ambulatory Visit (HOSPITAL_COMMUNITY)
Admission: RE | Admit: 2018-03-11 | Discharge: 2018-03-11 | Disposition: A | Discharge status: Home / Self Care | Payer: Medicare HMO | Source: Ambulatory Visit | Attending: Family Medicine | Admitting: Family Medicine

## 2018-03-11 ENCOUNTER — Ambulatory Visit (HOSPITAL_COMMUNITY)
Admission: RE | Admit: 2018-03-11 | Discharge: 2018-03-11 | Disposition: A | Discharge status: Home / Self Care | Payer: Medicare HMO | Source: Ambulatory Visit | Attending: Family | Admitting: Family

## 2018-03-11 DIAGNOSIS — C4362 Malignant melanoma of left upper limb, including shoulder: Secondary | ICD-10-CM | POA: Insufficient documentation

## 2018-03-11 DIAGNOSIS — C439 Malignant melanoma of skin, unspecified: Secondary | ICD-10-CM | POA: Diagnosis present

## 2018-03-11 DIAGNOSIS — Z880 Allergy status to penicillin: Secondary | ICD-10-CM | POA: Insufficient documentation

## 2018-03-11 HISTORY — PX: IR IMAGING GUIDED PORT INSERTION: IMG5740

## 2018-03-11 LAB — CBC
HEMATOCRIT: 38.1 % (ref 36.0–46.0)
HEMOGLOBIN: 12.2 g/dL (ref 12.0–15.0)
MCH: 26.4 pg (ref 26.0–34.0)
MCHC: 32 g/dL (ref 30.0–36.0)
MCV: 82.5 fL (ref 78.0–100.0)
Platelets: 377 10*3/uL (ref 150–400)
RBC: 4.62 MIL/uL (ref 3.87–5.11)
RDW: 15 % (ref 11.5–15.5)
WBC: 8.5 10*3/uL (ref 4.0–10.5)

## 2018-03-11 LAB — PROTIME-INR
INR: 0.97
PROTHROMBIN TIME: 12.8 s (ref 11.4–15.2)

## 2018-03-11 LAB — APTT: APTT: 32 s (ref 24–36)

## 2018-03-11 MED ORDER — VANCOMYCIN HCL IN DEXTROSE 1-5 GM/200ML-% IV SOLN
1000.0000 mg | Freq: Once | INTRAVENOUS | Status: DC
  Filled 2018-03-11: qty 200

## 2018-03-11 MED ORDER — LIDOCAINE HCL 1 % IJ SOLN
INTRAMUSCULAR | Status: AC | PRN
  Administered 2018-03-11: 20 mL

## 2018-03-11 MED ORDER — SODIUM CHLORIDE 0.9 % IV SOLN: INTRAVENOUS | Status: DC

## 2018-03-11 MED ORDER — VANCOMYCIN HCL 10 G IV SOLR
1500.0000 mg | Freq: Once | INTRAVENOUS | Status: AC
  Administered 2018-03-11: 1500 mg via INTRAVENOUS
  Filled 2018-03-11: qty 1500

## 2018-03-11 MED ORDER — MIDAZOLAM HCL 2 MG/2ML IJ SOLN
INTRAMUSCULAR | Status: AC | PRN
  Administered 2018-03-11 (×2): 1 mg via INTRAVENOUS

## 2018-03-11 MED ORDER — FENTANYL CITRATE (PF) 100 MCG/2ML IJ SOLN
INTRAMUSCULAR | Status: AC | PRN
  Administered 2018-03-11 (×2): 50 ug via INTRAVENOUS

## 2018-03-11 MED ORDER — LIDOCAINE HCL 1 % IJ SOLN
INTRAMUSCULAR | Status: AC
  Filled 2018-03-11: qty 20

## 2018-03-11 MED ORDER — MIDAZOLAM HCL 2 MG/2ML IJ SOLN
INTRAMUSCULAR | Status: AC
  Filled 2018-03-11: qty 2

## 2018-03-11 MED ORDER — VANCOMYCIN HCL IN DEXTROSE 1-5 GM/200ML-% IV SOLN
INTRAVENOUS | Status: AC
  Filled 2018-03-11: qty 200

## 2018-03-11 MED ORDER — FENTANYL CITRATE (PF) 100 MCG/2ML IJ SOLN
INTRAMUSCULAR | Status: AC
  Filled 2018-03-11: qty 2

## 2018-03-11 MED ORDER — HEPARIN SOD (PORK) LOCK FLUSH 100 UNIT/ML IV SOLN
INTRAVENOUS | Status: AC
  Filled 2018-03-11: qty 5

## 2018-03-11 NOTE — Procedures (Signed)
Interventional Radiology Procedure Note  Procedure: Single Lumen Power Port Placement    Access:  Right IJ vein.  Findings: Catheter tip positioned at SVC/RA junction. Port is ready for immediate use.   Complications: None  EBL: < 10 mL  Recommendations:  - Ok to shower in 24 hours - Do not submerge for 7 days - Routine line care   Walker Paddack T. Seriyah Collison, M.D Pager:  [phone]   

## 2018-03-11 NOTE — Discharge Instructions (Addendum)
Implanted Port Insertion, Care After °This sheet gives you information about how to care for yourself after your procedure. Your health care provider may also give you more specific instructions. If you have problems or questions, contact your health care provider. °What can I expect after the procedure? °After your procedure, it is common to have: °· Discomfort at the port insertion site. °· Bruising on the skin over the port. This should improve over 3-4 days. ° °Follow these instructions at home: °Port care °· After your port is placed, you will get a manufacturer's information card. The card has information about your port. Keep this card with you at all times. °· Take care of the port as told by your health care provider. Ask your health care provider if you or a family member can get training for taking care of the port at home. A home health care nurse may also take care of the port. °· Make sure to remember what type of port you have. °Incision care °· Follow instructions from your health care provider about how to take care of your port insertion site. Make sure you: °? Wash your hands with soap and water before you change your bandage (dressing). If soap and water are not available, use hand sanitizer. °? Change your dressing as told by your health care provider. °? Leave stitches (sutures), skin glue, or adhesive strips in place. These skin closures may need to stay in place for 2 weeks or longer. If adhesive strip edges start to loosen and curl up, you may trim the loose edges. Do not remove adhesive strips completely unless your health care provider tells you to do that. °· Check your port insertion site every day for signs of infection. Check for: °? More redness, swelling, or pain. °? More fluid or blood. °? Warmth. °? Pus or a bad smell. °General instructions °· Do not take baths, swim, or use a hot tub until your health care provider approves. °· Do not lift anything that is heavier than 10 lb (4.5  kg) for a week, or as told by your health care provider. °· Ask your health care provider when it is okay to: °? Return to work or school. °? Resume usual physical activities or sports. °· Do not drive for 24 hours if you were given a medicine to help you relax (sedative). °· Take over-the-counter and prescription medicines only as told by your health care provider. °· Wear a medical alert bracelet in case of an emergency. This will tell any health care providers that you have a port. °· Keep all follow-up visits as told by your health care provider. This is important. °Contact a health care provider if: °· You cannot flush your port with saline as directed, or you cannot draw blood from the port. °· You have a fever or chills. °· You have more redness, swelling, or pain around your port insertion site. °· You have more fluid or blood coming from your port insertion site. °· Your port insertion site feels warm to the touch. °· You have pus or a bad smell coming from the port insertion site. °Get help right away if: °· You have chest pain or shortness of breath. °· You have bleeding from your port that you cannot control. °Summary °· Take care of the port as told by your health care provider. °· Change your dressing as told by your health care provider. °· Keep all follow-up visits as told by your health care provider. °  This information is not intended to replace advice given to you by your health care provider. Make sure you discuss any questions you have with your health care provider. °Document Released: 04/08/2013 Document Revised: 05/09/2016 Document Reviewed: 05/09/2016 °Elsevier Interactive Patient Education © 2017 [employer] °Moderate Conscious Sedation, Adult, Care After °These instructions provide you with information about caring for yourself after your procedure. Your health care provider may also give you more specific instructions. Your treatment has been planned according to current medical  practices, but problems sometimes occur. Call your health care provider if you have any problems or questions after your procedure. °What can I expect after the procedure? °After your procedure, it is common: °· To feel sleepy for several hours. °· To feel clumsy and have poor balance for several hours. °· To have poor judgment for several hours. °· To vomit if you eat too soon. ° °Follow these instructions at home: °For at least 24 hours after the procedure: ° °· Do not: °? Participate in activities where you could fall or become injured. °? Drive. °? Use heavy machinery. °? Drink alcohol. °? Take sleeping pills or medicines that cause drowsiness. °? Make important decisions or sign legal documents. °? Take care of children on your own. °· Rest. °Eating and drinking °· Follow the diet recommended by your health care provider. °· If you vomit: °? Drink water, juice, or soup when you can drink without vomiting. °? Make sure you have little or no nausea before eating solid foods. °General instructions °· Have a responsible adult stay with you until you are awake and alert. °· Take over-the-counter and prescription medicines only as told by your health care provider. °· If you smoke, do not smoke without supervision. °· Keep all follow-up visits as told by your health care provider. This is important. °Contact a health care provider if: °· You keep feeling nauseous or you keep vomiting. °· You feel light-headed. °· You develop a rash. °· You have a fever. °Get help right away if: °· You have trouble breathing. °This information is not intended to replace advice given to you by your health care provider. Make sure you discuss any questions you have with your health care provider. °Document Released: 04/08/2013 Document Revised: 11/21/2015 Document Reviewed: 10/08/2015 °Elsevier Interactive Patient Education © 2018 [employer]vier Inc. ° °

## 2018-03-11 NOTE — H&P (Signed)
Chief Complaint: Patient was seen in consultation today for port a cath placement  Referring Physician(s): Clinton M/Ennever,P  Supervising Physician: Aletta Edouard  Patient Status: [facility] - Out-pt  History of Present Illness: Brittany Williamson is a 72 y.o. female with history of stage IIIA left shoulder melanoma and poor venous access who presents today for Port-A-Cath placement for continued treatment.  Past Medical History:  Diagnosis Date  . Goals of care, counseling/discussion 12/02/2017  . Malignant melanoma of arm, left ([place]) 12/02/2017    No past surgical history on file.  Allergies: Bee venom; Penicillins; and Beeswax  Medications: Prior to Admission medications   Medication Sig Start Date End Date Taking? Authorizing Provider  atorvastatin (LIPITOR) 20 MG tablet Take 20 mg by mouth daily. 01/07/14  Yes [provider]  carvedilol (COREG) 3.125 MG tablet Take 3.125 mg by mouth 2 (two) times daily. 01/26/14  Yes [provider]  DULoxetine (CYMBALTA) 30 MG capsule Take 30 mg by mouth daily. 11/04/17  Yes [provider]  valsartan-hydrochlorothiazide (DIOVAN-HCT) 80-12.5 MG tablet  01/19/14  Yes [provider]  nystatin cream (MYCOSTATIN) Apply 1 application topically 2 (two) times daily. 01/21/18   [place], Holli Humbles, NP     No family history on file.  Social History   Socioeconomic History  . Marital status: Single    Spouse name: Not on file  . Number of children: Not on file  . Years of education: Not on file  . Highest education level: Not on file  Occupational History  . Not on file  Social Needs  . Financial resource strain: Not on file  . Food insecurity:    Worry: Not on file    Inability: Not on file  . Transportation needs:    Medical: Not on file    Non-medical: Not on file  Tobacco Use  . Smoking status: Not on file  Substance and Sexual Activity  . Alcohol use: Not on file  . Drug use: Not on file  .  Sexual activity: Not on file  Lifestyle  . Physical activity:    Days per week: Not on file    Minutes per session: Not on file  . Stress: Not on file  Relationships  . Social connections:    Talks on phone: Not on file    Gets together: Not on file    Attends religious service: Not on file    Active member of club or organization: Not on file    Attends meetings of clubs or organizations: Not on file    Relationship status: Not on file  Other Topics Concern  . Not on file  Social History Narrative  . Not on file      Review of Systems denies fever, chest pain, back pain, nausea, vomiting or bleeding. She does have headaches, some dyspnea with exertion, intermittent abdominal pain and occasional loose stools.  Vital Signs: BP (!) 145/62   Pulse 80   Temp 98.1 F (36.7 C) (Oral)   Resp 18   Ht 5\' 5"  (1.651 m)   Wt 244 lb (110.7 kg)   SpO2 94%   BMI 40.60 kg/m   Physical Exam awake, alert.  Chest clear to auscultation bilaterally.  Heart with regular rate and rhythm.  Abdomen obese, soft, positive bowel sounds, nontender.  Extremities with full range of motion.  Imaging: No results found.  Labs:  CBC: Recent Labs    12/23/17 0833 01/17/18 0745 02/14/18 1344 03/11/18 0809  WBC 7.1 7.2 9.3 8.5  HGB 12.5 13.1 13.1 12.2  HCT 38.1 39.8 39.7 38.1  PLT 261 245 295 377    COAGS: Recent Labs    03/11/18 0809  INR 0.97  APTT 32    BMP: Recent Labs    12/02/17 1314 12/23/17 0833 01/17/18 0745 02/14/18 1344  NA 140 143 139 136  K 4.2 3.8 3.9 3.7  CL 106 106 105 101  CO2 24 25 23 25   GLUCOSE 91 111 120* 100*  BUN 20 19 20 18   CALCIUM 9.7 9.2 9.5 9.4  CREATININE 0.97 1.00 0.80 0.87  GFRNONAA 57*  --  >60 >60  GFRAA >60  --  >60 >60    LIVER FUNCTION TESTS: Recent Labs    12/02/17 1314 12/23/17 0833 01/17/18 0745 02/14/18 1344  BILITOT 0.4 0.7 0.4 0.7  AST 23 24 18 20   ALT 15 29 18 19   ALKPHOS 75 72 77 94  PROT 7.5 7.2 7.6 7.9  ALBUMIN 3.9  3.6 4.0 4.0    TUMOR MARKERS: No results for input(s): AFPTM, CEA, CA199, CHROMGRNA in the last 8760 hours.  Assessment and Plan: 72 y.o. female with history of stage IIIA left shoulder melanoma and poor venous access who presents today for Port-A-Cath placement for continued treatment.Risks and benefits of image guided port-a-catheter placement was discussed with the patient/spouse including, but not limited to bleeding, infection, pneumothorax, or fibrin sheath development and need for additional procedures.  All of the patient's questions were answered, patient is agreeable to proceed. Consent signed and in chart.     Thank you for this interesting consult.  I greatly enjoyed meeting Brittany Williamson and look forward to participating in their care.  A copy of this report was sent to the requesting provider on this date.  Electronically Signed: D. Rowe Robert, PA-C 03/11/2018, 8:47 AM   I spent a total of 20 minutes in face to face in clinical consultation, greater than 50% of which was counseling/coordinating care for port a cath placement

## 2018-03-11 NOTE — Sedation Documentation (Signed)
Patient is resting comfortably with eyes closed in NAD. 

## 2018-03-14 ENCOUNTER — Inpatient Hospital Stay: Payer: Medicare HMO | Attending: Hematology & Oncology

## 2018-03-14 DIAGNOSIS — Z79899 Other long term (current) drug therapy: Secondary | ICD-10-CM | POA: Diagnosis not present

## 2018-03-14 DIAGNOSIS — C4362 Malignant melanoma of left upper limb, including shoulder: Secondary | ICD-10-CM | POA: Insufficient documentation

## 2018-03-14 DIAGNOSIS — F329 Major depressive disorder, single episode, unspecified: Secondary | ICD-10-CM | POA: Diagnosis not present

## 2018-03-14 DIAGNOSIS — Z5112 Encounter for antineoplastic immunotherapy: Secondary | ICD-10-CM | POA: Insufficient documentation

## 2018-03-14 DIAGNOSIS — G43909 Migraine, unspecified, not intractable, without status migrainosus: Secondary | ICD-10-CM | POA: Diagnosis not present

## 2018-03-14 LAB — CBC WITH DIFFERENTIAL (CANCER CENTER ONLY)
Basophils Absolute: 0.1 10*3/uL (ref 0.0–0.1)
Basophils Relative: 1 %
EOS ABS: 0.2 10*3/uL (ref 0.0–0.5)
Eosinophils Relative: 2 %
HCT: 36.4 % (ref 34.8–46.6)
Hemoglobin: 11.9 g/dL (ref 11.6–15.9)
Lymphocytes Relative: 31 %
Lymphs Abs: 2.9 10*3/uL (ref 0.9–3.3)
MCH: 26.9 pg (ref 26.0–34.0)
MCHC: 32.7 g/dL (ref 32.0–36.0)
MCV: 82.4 fL (ref 81.0–101.0)
MONO ABS: 0.8 10*3/uL (ref 0.1–0.9)
MONOS PCT: 8 %
Neutro Abs: 5.3 10*3/uL (ref 1.5–6.5)
Neutrophils Relative %: 58 %
Platelet Count: 299 10*3/uL (ref 145–400)
RBC: 4.42 MIL/uL (ref 3.70–5.32)
RDW: 14.7 % (ref 11.1–15.7)
WBC Count: 9.2 10*3/uL (ref 3.9–10.0)

## 2018-03-14 LAB — CMP (CANCER CENTER ONLY)
ALT: 13 U/L (ref 0–44)
AST: 21 U/L (ref 15–41)
Albumin: 3.7 g/dL (ref 3.5–5.0)
Alkaline Phosphatase: 82 U/L (ref 38–126)
Anion gap: 10 (ref 5–15)
BILIRUBIN TOTAL: 0.4 mg/dL (ref 0.3–1.2)
BUN: 21 mg/dL (ref 8–23)
CHLORIDE: 105 mmol/L (ref 98–111)
CO2: 24 mmol/L (ref 22–32)
Calcium: 9.8 mg/dL (ref 8.9–10.3)
Creatinine: 0.87 mg/dL (ref 0.44–1.00)
GFR, Est AFR Am: >60 mL/min (ref >60)
GLUCOSE: 105 mg/dL — AB (ref 70–99)
Potassium: 3.8 mmol/L (ref 3.5–5.1)
Sodium: 139 mmol/L (ref 135–145)
TOTAL PROTEIN: 7.7 g/dL (ref 6.5–8.1)

## 2018-03-14 LAB — LACTATE DEHYDROGENASE: LDH: 162 U/L (ref 98–192)

## 2018-03-14 LAB — TSH

## 2018-03-17 ENCOUNTER — Other Ambulatory Visit: Payer: Self-pay

## 2018-03-17 ENCOUNTER — Encounter: Payer: Self-pay | Admitting: Hematology & Oncology

## 2018-03-17 ENCOUNTER — Inpatient Hospital Stay: Payer: Medicare HMO | Admitting: Hematology & Oncology

## 2018-03-17 ENCOUNTER — Inpatient Hospital Stay: Payer: Medicare HMO

## 2018-03-17 ENCOUNTER — Other Ambulatory Visit: Payer: Self-pay | Admitting: *Deleted

## 2018-03-17 VITALS — BP 138/59 | HR 94 | Temp 97.7°F | Resp 20 | Wt 249.0 lb

## 2018-03-17 DIAGNOSIS — C4362 Malignant melanoma of left upper limb, including shoulder: Secondary | ICD-10-CM | POA: Diagnosis not present

## 2018-03-17 DIAGNOSIS — Z5112 Encounter for antineoplastic immunotherapy: Secondary | ICD-10-CM | POA: Diagnosis not present

## 2018-03-17 DIAGNOSIS — F329 Major depressive disorder, single episode, unspecified: Secondary | ICD-10-CM

## 2018-03-17 DIAGNOSIS — G43909 Migraine, unspecified, not intractable, without status migrainosus: Secondary | ICD-10-CM | POA: Diagnosis not present

## 2018-03-17 MED ORDER — BUTALBITAL-ASPIRIN-CAFFEINE 50-325-40 MG PO CAPS: 1.0000 | ORAL_CAPSULE | Freq: Four times a day (QID) | ORAL | 0 refills | Status: AC | PRN

## 2018-03-17 MED ORDER — SODIUM CHLORIDE 0.9 % IV SOLN
480.0000 mg | Freq: Once | INTRAVENOUS | Status: AC
  Administered 2018-03-17: 480 mg via INTRAVENOUS
  Filled 2018-03-17: qty 48

## 2018-03-17 MED ORDER — SODIUM CHLORIDE 0.9% FLUSH
10.0000 mL | INTRAVENOUS | Status: DC | PRN
  Administered 2018-03-17: 10 mL via INTRAVENOUS
  Filled 2018-03-17: qty 10

## 2018-03-17 MED ORDER — LIDOCAINE-PRILOCAINE 2.5-2.5 % EX CREA: 1.0000 "application " | TOPICAL_CREAM | CUTANEOUS | 0 refills | Status: AC | PRN

## 2018-03-17 MED ORDER — SODIUM CHLORIDE 0.9 % IV SOLN
Freq: Once | INTRAVENOUS | Status: AC
  Administered 2018-03-17: 11:00:00 via INTRAVENOUS
  Filled 2018-03-17: qty 250

## 2018-03-17 MED ORDER — HEPARIN SOD (PORK) LOCK FLUSH 100 UNIT/ML IV SOLN
500.0000 [IU] | Freq: Once | INTRAVENOUS | Status: AC
  Administered 2018-03-17: 500 [IU] via INTRAVENOUS
  Filled 2018-03-17: qty 5

## 2018-03-17 MED ORDER — DOXYCYCLINE HYCLATE 100 MG PO TABS: 100.0000 mg | ORAL_TABLET | Freq: Two times a day (BID) | ORAL | 0 refills | Status: DC

## 2018-03-17 NOTE — Patient Instructions (Signed)
[facility] Discharge Instructions for Patients Receiving Chemotherapy  Today you received the following chemotherapy agents:  Nivolumab  To help prevent nausea and vomiting after your treatment, we encourage you to take your nausea medication as ordered per MD.    If you develop nausea and vomiting that is not controlled by your nausea medication, call the clinic.   BELOW ARE SYMPTOMS THAT SHOULD BE REPORTED IMMEDIATELY:  *FEVER GREATER THAN 100.5 F  *CHILLS WITH OR WITHOUT FEVER  NAUSEA AND VOMITING THAT IS NOT CONTROLLED WITH YOUR NAUSEA MEDICATION  *UNUSUAL SHORTNESS OF BREATH  *UNUSUAL BRUISING OR BLEEDING  TENDERNESS IN MOUTH AND THROAT WITH OR WITHOUT PRESENCE OF ULCERS  *URINARY PROBLEMS  *BOWEL PROBLEMS  UNUSUAL RASH Items with * indicate a potential emergency and should be followed up as soon as possible.  Feel free to call the clinic should you have any questions or concerns. The clinic phone number is [phone].  Please show the CHEMO ALERT CARD at check-in to the Emergency Department and triage nurse.   

## 2018-03-17 NOTE — Progress Notes (Signed)
Hematology and Oncology Follow Up Visit  Brittany Williamson [id number] 1946-03-16 72 y.o. 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  She is doing quite well.  She is a little worried about the possibility of having some cellulitis on her lower legs.  She does have some stasis dermatitis type changes.  However, in order to be cautious, I will put her on some doxycycline (100 mg p.o. twice daily x10 days) and see if this does not help.  She is had no diarrhea.  Is been no cough or shortness of breath.  She is had no headache although migraines do occur on her on rare occasion.  She says that Fiorinal has been the best remedy for this.  I will send in a prescription for this.  She is had no fever.  There is been no bleeding.  She is had no diarrhea.  She has had no change in bladder habits.  Overall, her performance status is ECOG 1.    Medications:  Allergies as of 03/17/2018      Reactions   Bee Venom Other (See Comments)   Penicillins Rash   Beeswax Other (See Comments)   Bee stings. Bee stings. Bee stings.      Medication List        Accurate as of 03/17/18  9:53 AM. Always use your most recent med list.          atorvastatin 20 MG tablet Commonly known as:  LIPITOR Take 20 mg by mouth daily.   carvedilol 3.125 MG tablet Commonly known as:  COREG Take 3.125 mg by mouth 2 (two) times daily.   DULoxetine 30 MG capsule Commonly known as:  CYMBALTA Take 30 mg by mouth daily.   nystatin cream Commonly known as:  MYCOSTATIN Apply 1 application topically 2 (two) times daily.   valsartan-hydrochlorothiazide 80-12.5 MG tablet Commonly known as:  DIOVAN-HCT       Allergies:  Allergies  Allergen Reactions  . Bee Venom Other (See Comments)  .  Penicillins Rash  . Beeswax Other (See Comments)    Bee stings. Bee stings.  Bee stings.    Past Medical History, Surgical history, Social history, and Family History were reviewed and updated.  Review of Systems: Review of Systems  Constitutional: Negative.   HENT: Negative.   Eyes: Negative.   Respiratory: Negative.   Cardiovascular: Negative.   Gastrointestinal: Negative.   Genitourinary: Negative.   Musculoskeletal: Negative.   Skin: Negative.   Neurological: Negative.   Endo/Heme/Allergies: Negative.   Psychiatric/Behavioral: Positive for depression.     Physical Exam:  vitals were not taken for this visit.   Wt Readings from Last 3 Encounters:  03/11/18 244 lb (110.7 kg)  02/17/18 252 lb 4 oz (114.4 kg)  01/20/18 254 lb (115.2 kg)    Physical Exam  Constitutional: She is oriented to person, place, and time.  HENT:  Head: Normocephalic and atraumatic.  Mouth/Throat: Oropharynx is clear and moist.  Eyes: Pupils are equal, round, and reactive to light. EOM are normal.  Neck: Normal range of motion.  Cardiovascular: Normal rate, regular rhythm and normal heart sounds.  Pulmonary/Chest: Effort normal and breath sounds normal.  Abdominal: Soft. Bowel sounds are normal.  Musculoskeletal: Normal range of motion. She  exhibits no edema, tenderness or deformity.  Lymphadenopathy:    She has no cervical adenopathy.  Neurological: She is alert and oriented to person, place, and time.  Skin: Skin is warm and dry. No rash noted. No erythema.  Psychiatric: She has a normal mood and affect. Her behavior is normal. Judgment and thought content normal.  Vitals reviewed.    Lab Results  Component Value Date   WBC 9.2 03/14/2018   HGB 11.9 03/14/2018   HCT 36.4 03/14/2018   MCV 82.4 03/14/2018   PLT 299 03/14/2018   No results found for: FERRITIN, IRON, TIBC, UIBC, IRONPCTSAT Lab Results  Component Value Date   RBC 4.42 03/14/2018   No results found for:  KPAFRELGTCHN, LAMBDASER, KAPLAMBRATIO No results found for: IGGSERUM, IGA, IGMSERUM No results found for: Ronnald Ramp, A1GS, A2GS, Tillman Sers, SPEI   Chemistry      Component Value Date/Time   NA 139 03/14/2018 0932   K 3.8 03/14/2018 0932   CL 105 03/14/2018 0932   CO2 24 03/14/2018 0932   BUN 21 03/14/2018 0932   CREATININE 0.87 03/14/2018 0932      Component Value Date/Time   CALCIUM 9.8 03/14/2018 0932   ALKPHOS 82 03/14/2018 0932   AST 21 03/14/2018 0932   ALT 13 03/14/2018 0932   BILITOT 0.4 03/14/2018 0932      Impression and Plan: Brittany Williamson is a very pleasant 72 yo caucasian female with 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   She had a wide local excision and left axillary node excision on 11/05/2017.  We will go ahead with her fourth cycle of Opdivo.  I think this is reasonable to treat her today.  Again, we will see how the doxycycline does for her.  We are following her TSH.  As far as the depression, hopefully the increase in Cymbalta will help this.  We will plan to get her back in 1 month.    Volanda Napoleon, MD 9/16/20199:53 AM

## 2018-03-21 ENCOUNTER — Encounter: Payer: Self-pay | Admitting: Hematology & Oncology

## 2018-04-11 ENCOUNTER — Inpatient Hospital Stay: Payer: Medicare HMO | Attending: Hematology & Oncology

## 2018-04-11 DIAGNOSIS — Z79899 Other long term (current) drug therapy: Secondary | ICD-10-CM | POA: Insufficient documentation

## 2018-04-11 DIAGNOSIS — F329 Major depressive disorder, single episode, unspecified: Secondary | ICD-10-CM | POA: Diagnosis not present

## 2018-04-11 DIAGNOSIS — Z23 Encounter for immunization: Secondary | ICD-10-CM | POA: Insufficient documentation

## 2018-04-11 DIAGNOSIS — Z5112 Encounter for antineoplastic immunotherapy: Secondary | ICD-10-CM | POA: Diagnosis not present

## 2018-04-11 DIAGNOSIS — C4362 Malignant melanoma of left upper limb, including shoulder: Secondary | ICD-10-CM | POA: Diagnosis present

## 2018-04-11 LAB — CBC WITH DIFFERENTIAL (CANCER CENTER ONLY)
ABS IMMATURE GRANULOCYTES: 0.03 10*3/uL (ref 0.00–0.07)
BASOS ABS: 0 10*3/uL (ref 0.0–0.1)
Basophils Relative: 0 %
EOS ABS: 0.2 10*3/uL (ref 0.0–0.5)
Eosinophils Relative: 3 %
HEMATOCRIT: 41 % (ref 36.0–46.0)
Hemoglobin: 13.1 g/dL (ref 12.0–15.0)
IMMATURE GRANULOCYTES: 0 %
LYMPHS ABS: 3.7 10*3/uL (ref 0.7–4.0)
Lymphocytes Relative: 38 %
MCH: 26 pg (ref 26.0–34.0)
MCHC: 32 g/dL (ref 30.0–36.0)
MCV: 81.5 fL (ref 80.0–100.0)
MONOS PCT: 7 %
Monocytes Absolute: 0.7 10*3/uL (ref 0.1–1.0)
NEUTROS ABS: 5 10*3/uL (ref 1.7–7.7)
NEUTROS PCT: 52 %
NRBC: 0 % (ref 0.0–0.2)
PLATELETS: 319 10*3/uL (ref 150–400)
RBC: 5.03 MIL/uL (ref 3.87–5.11)
RDW: 15.5 % (ref 11.5–15.5)
WBC Count: 9.7 10*3/uL (ref 4.0–10.5)

## 2018-04-11 LAB — CMP (CANCER CENTER ONLY)
ALBUMIN: 4 g/dL (ref 3.5–5.0)
ALT: 18 U/L (ref 0–44)
AST: 22 U/L (ref 15–41)
Alkaline Phosphatase: 79 U/L (ref 38–126)
Anion gap: 11 (ref 5–15)
BILIRUBIN TOTAL: 0.4 mg/dL (ref 0.3–1.2)
BUN: 19 mg/dL (ref 8–23)
CHLORIDE: 103 mmol/L (ref 98–111)
CO2: 23 mmol/L (ref 22–32)
CREATININE: 0.93 mg/dL (ref 0.44–1.00)
Calcium: 10.1 mg/dL (ref 8.9–10.3)
GFR, Est AFR Am: >60 mL/min (ref >60)
GFR, Estimated: 59 mL/min — ABNORMAL LOW (ref >60)
GLUCOSE: 96 mg/dL (ref 70–99)
POTASSIUM: 4.3 mmol/L (ref 3.5–5.1)
Sodium: 137 mmol/L (ref 135–145)
Total Protein: 7.9 g/dL (ref 6.5–8.1)

## 2018-04-11 LAB — LACTATE DEHYDROGENASE: LDH: 176 U/L (ref 98–192)

## 2018-04-14 ENCOUNTER — Inpatient Hospital Stay: Payer: Medicare HMO

## 2018-04-14 ENCOUNTER — Encounter: Payer: Self-pay | Admitting: Hematology & Oncology

## 2018-04-14 ENCOUNTER — Other Ambulatory Visit: Payer: Self-pay

## 2018-04-14 ENCOUNTER — Inpatient Hospital Stay (HOSPITAL_BASED_OUTPATIENT_CLINIC_OR_DEPARTMENT_OTHER): Payer: Medicare HMO | Admitting: Hematology & Oncology

## 2018-04-14 VITALS — BP 156/78 | HR 86 | Temp 98.0°F | Resp 18 | Wt 250.0 lb

## 2018-04-14 DIAGNOSIS — C4362 Malignant melanoma of left upper limb, including shoulder: Secondary | ICD-10-CM | POA: Diagnosis not present

## 2018-04-14 DIAGNOSIS — F329 Major depressive disorder, single episode, unspecified: Secondary | ICD-10-CM | POA: Diagnosis not present

## 2018-04-14 DIAGNOSIS — Z5112 Encounter for antineoplastic immunotherapy: Secondary | ICD-10-CM | POA: Diagnosis not present

## 2018-04-14 MED ORDER — HEPARIN SOD (PORK) LOCK FLUSH 100 UNIT/ML IV SOLN
500.0000 [IU] | Freq: Once | INTRAVENOUS | Status: AC | PRN
  Administered 2018-04-14: 500 [IU]
  Filled 2018-04-14: qty 5

## 2018-04-14 MED ORDER — SODIUM CHLORIDE 0.9 % IV SOLN
480.0000 mg | Freq: Once | INTRAVENOUS | Status: AC
  Administered 2018-04-14: 480 mg via INTRAVENOUS
  Filled 2018-04-14: qty 48

## 2018-04-14 MED ORDER — INFLUENZA VAC SPLIT QUAD 0.5 ML IM SUSY
0.5000 mL | PREFILLED_SYRINGE | Freq: Once | INTRAMUSCULAR | Status: AC
  Administered 2018-04-14: 0.5 mL via INTRAMUSCULAR

## 2018-04-14 MED ORDER — SODIUM CHLORIDE 0.9 % IV SOLN
Freq: Once | INTRAVENOUS | Status: AC
  Administered 2018-04-14: 10:00:00 via INTRAVENOUS
  Filled 2018-04-14: qty 250

## 2018-04-14 MED ORDER — SODIUM CHLORIDE 0.9% FLUSH
10.0000 mL | INTRAVENOUS | Status: DC | PRN
  Administered 2018-04-14: 10 mL
  Filled 2018-04-14: qty 10

## 2018-04-14 MED ORDER — INFLUENZA VAC SPLIT QUAD 0.5 ML IM SUSY
PREFILLED_SYRINGE | INTRAMUSCULAR | Status: AC
  Filled 2018-04-14: qty 0.5

## 2018-04-14 NOTE — Patient Instructions (Signed)
Nivolumab injection What is this medicine? NIVOLUMAB (nye VOL ue mab) is a monoclonal antibody. It is used to treat melanoma, lung cancer, kidney cancer, head and neck cancer, Hodgkin lymphoma, urothelial cancer, colon cancer, and liver cancer. This medicine may be used for other purposes; ask your health care provider or pharmacist if you have questions. COMMON BRAND NAME(S): Opdivo What should I tell my health care provider before I take this medicine? They need to know if you have any of these conditions: -diabetes -immune system problems -kidney disease -liver disease -lung disease -organ transplant -stomach or intestine problems -thyroid disease -an unusual or allergic reaction to nivolumab, other medicines, foods, dyes, or preservatives -pregnant or trying to get pregnant -breast-feeding How should I use this medicine? This medicine is for infusion into a vein. It is given by a health care professional in a hospital or clinic setting. A special MedGuide will be given to you before each treatment. Be sure to read this information carefully each time. Talk to your pediatrician regarding the use of this medicine in children. While this drug may be prescribed for children as young as 12 years for selected conditions, precautions do apply. Overdosage: If you think you have taken too much of this medicine contact a poison control center or emergency room at once. NOTE: This medicine is only for you. Do not share this medicine with others. What if I miss a dose? It is important not to miss your dose. Call your doctor or health care professional if you are unable to keep an appointment. What may interact with this medicine? Interactions have not been studied. Give your health care provider a list of all the medicines, herbs, non-prescription drugs, or dietary supplements you use. Also tell them if you smoke, drink alcohol, or use illegal drugs. Some items may interact with your  medicine. This list may not describe all possible interactions. Give your health care provider a list of all the medicines, herbs, non-prescription drugs, or dietary supplements you use. Also tell them if you smoke, drink alcohol, or use illegal drugs. Some items may interact with your medicine. What should I watch for while using this medicine? This drug may make you feel generally unwell. Continue your course of treatment even though you feel ill unless your doctor tells you to stop. You may need blood work done while you are taking this medicine. Do not become pregnant while taking this medicine or for 5 months after stopping it. Women should inform their doctor if they wish to become pregnant or think they might be pregnant. There is a potential for serious side effects to an unborn child. Talk to your health care professional or pharmacist for more information. Do not breast-feed an infant while taking this medicine. What side effects may I notice from receiving this medicine? Side effects that you should report to your doctor or health care professional as soon as possible: -allergic reactions like skin rash, itching or hives, swelling of the face, lips, or tongue -black, tarry stools -blood in the urine -bloody or watery diarrhea -changes in vision -change in sex drive -changes in emotions or moods -chest pain -confusion -cough -decreased appetite -diarrhea -facial flushing -feeling faint or lightheaded -fever, chills -hair loss -hallucination, loss of contact with reality -headache -irritable -joint pain -loss of memory -muscle pain -muscle weakness -seizures -shortness of breath -signs and symptoms of high blood sugar such as dizziness; dry mouth; dry skin; fruity breath; nausea; stomach pain; increased hunger or thirst; increased   urination -signs and symptoms of kidney injury like trouble passing urine or change in the amount of urine -signs and symptoms of liver injury  like dark yellow or brown urine; general ill feeling or flu-like symptoms; light-colored stools; loss of appetite; nausea; right upper belly pain; unusually weak or tired; yellowing of the eyes or skin -stiff neck -swelling of the ankles, feet, hands -weight gain Side effects that usually do not require medical attention (report to your doctor or health care professional if they continue or are bothersome): -bone pain -constipation -tiredness -vomiting This list may not describe all possible side effects. Call your doctor for medical advice about side effects. You may report side effects to FDA at 1-800-FDA-1088. Where should I keep my medicine? This drug is given in a hospital or clinic and will not be stored at home. NOTE: This sheet is a summary. It may not cover all possible information. If you have questions about this medicine, talk to your doctor, pharmacist, or health care provider.  2018 Elsevier/Gold Standard (2016-03-26 17:49:34)  

## 2018-04-14 NOTE — Progress Notes (Signed)
Hematology and Oncology Follow Up Visit  Brittany Williamson [id number] 05/28/1946 72 y.o. 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  She had a tough week last week.  There were several deaths of her family and friends.  This is been somewhat difficult for her to handle.  She does have a strong faith.  She is a funeral yesterday that had 600 people attend.  This was for a friend who apparently had some influence in her life and many other lives.  As far as the melanoma and the Opdivo are concerned, she is doing quite well.  She is tolerating this pretty nicely.  She has had no issues with diarrhea.  She has had no problems with nausea.  She has had no skin rashes.  She has had no leg swelling.  She saw a doctor because she has some weakness in her legs.  She was told that "you drink too much water and you are too fat."  It is somewhat unfortunate that she was given this lack of respect.  She has had no problems with fever.  She is had no bleeding.  There has been no swollen lymph nodes.   Overall, her performance status is ECOG 1.    Medications:  Allergies as of 04/14/2018      Reactions   Bee Venom Other (See Comments)   Penicillins Rash   Beeswax Other (See Comments)   Bee stings. Bee stings. Bee stings. Bee stings.      Medication List        Accurate as of 04/14/18  9:43 AM. Always use your most recent med list.          atorvastatin 20 MG tablet Commonly known as:  LIPITOR Take 20 mg by mouth daily.   butalbital-aspirin-caffeine 50-325-40 MG capsule Commonly known as:  FIORINAL Take 1 capsule by mouth every 6 (six) hours as needed for headache.   carvedilol 3.125 MG tablet Commonly known as:  COREG Take 3.125 mg by mouth 2 (two) times  daily.   Diethylpropion HCl CR 75 MG Tb24 Take 75 mg by mouth daily.   doxycycline 100 MG tablet Commonly known as:  VIBRA-TABS Take 1 tablet (100 mg total) by mouth 2 (two) times daily.   DULoxetine 30 MG capsule Commonly known as:  CYMBALTA Take 30 mg by mouth daily.   lidocaine-prilocaine cream Commonly known as:  EMLA Apply 1 application topically as needed. Apply 1 hr prior to pac access and cover with plastic wrap   nystatin cream Commonly known as:  MYCOSTATIN Apply 1 application topically 2 (two) times daily.   valsartan-hydrochlorothiazide 80-12.5 MG tablet Commonly known as:  DIOVAN-HCT       Allergies:  Allergies  Allergen Reactions  . Bee Venom Other (See Comments)  . Penicillins Rash  . Beeswax Other (See Comments)    Bee stings. Bee stings.  Bee stings. Bee stings.    Past Medical History, Surgical history, Social history, and Family History were reviewed and updated.  Review of Systems: Review of Systems  Constitutional: Negative.   HENT: Negative.   Eyes: Negative.   Respiratory: Negative.   Cardiovascular: Negative.   Gastrointestinal: Negative.   Genitourinary: Negative.   Musculoskeletal: Negative.   Skin: Negative.  Neurological: Negative.   Endo/Heme/Allergies: Negative.   Psychiatric/Behavioral: Positive for depression.     Physical Exam:  weight is 250 lb (113.4 kg). Her oral temperature is 98 F (36.7 C). Her blood pressure is 156/78 (abnormal) and her pulse is 86. Her respiration is 18 and oxygen saturation is 96%.   Wt Readings from Last 3 Encounters:  04/14/18 250 lb (113.4 kg)  03/17/18 249 lb (112.9 kg)  03/11/18 244 lb (110.7 kg)    Physical Exam  Constitutional: She is oriented to person, place, and time.  HENT:  Head: Normocephalic and atraumatic.  Mouth/Throat: Oropharynx is clear and moist.  Eyes: Pupils are equal, round, and reactive to light. EOM are normal.  Neck: Normal range of motion.  Cardiovascular:  Normal rate, regular rhythm and normal heart sounds.  Pulmonary/Chest: Effort normal and breath sounds normal.  Abdominal: Soft. Bowel sounds are normal.  Musculoskeletal: Normal range of motion. She exhibits no edema, tenderness or deformity.  Lymphadenopathy:    She has no cervical adenopathy.  Neurological: She is alert and oriented to person, place, and time.  Skin: Skin is warm and dry. No rash noted. No erythema.  Psychiatric: She has a normal mood and affect. Her behavior is normal. Judgment and thought content normal.  Vitals reviewed.    Lab Results  Component Value Date   WBC 9.7 04/11/2018   HGB 13.1 04/11/2018   HCT 41.0 04/11/2018   MCV 81.5 04/11/2018   PLT 319 04/11/2018   No results found for: FERRITIN, IRON, TIBC, UIBC, IRONPCTSAT Lab Results  Component Value Date   RBC 5.03 04/11/2018   No results found for: KPAFRELGTCHN, LAMBDASER, KAPLAMBRATIO No results found for: IGGSERUM, IGA, IGMSERUM No results found for: Ronnald Ramp, A1GS, A2GS, Tillman Sers, SPEI   Chemistry      Component Value Date/Time   NA 137 04/11/2018 1335   K 4.3 04/11/2018 1335   CL 103 04/11/2018 1335   CO2 23 04/11/2018 1335   BUN 19 04/11/2018 1335   CREATININE 0.93 04/11/2018 1335      Component Value Date/Time   CALCIUM 10.1 04/11/2018 1335   ALKPHOS 79 04/11/2018 1335   AST 22 04/11/2018 1335   ALT 18 04/11/2018 1335   BILITOT 0.4 04/11/2018 1335      Impression and Plan: Brittany Williamson is a very pleasant 72 yo caucasian female with 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   She had a wide local excision and left axillary node excision on 11/05/2017.  We will proceed with her fifth cycle of Opdivo today.  I think she is doing well with this.  Will have her come back in 1 month.  Volanda Napoleon, MD 10/14/20199:43 AM

## 2018-04-14 NOTE — Addendum Note (Signed)
Addended by: Burney Gauze R on: 04/14/2018 10:20 AM   Modules accepted: Orders

## 2018-04-15 ENCOUNTER — Other Ambulatory Visit: Payer: Self-pay | Admitting: Hematology & Oncology

## 2018-04-15 DIAGNOSIS — C4362 Malignant melanoma of left upper limb, including shoulder: Secondary | ICD-10-CM

## 2018-04-15 LAB — TSH: TSH: 16.215 u[IU]/mL — AB (ref 0.308–3.960)

## 2018-04-15 NOTE — Progress Notes (Signed)
n

## 2018-04-16 ENCOUNTER — Other Ambulatory Visit: Payer: Self-pay | Admitting: *Deleted

## 2018-04-16 ENCOUNTER — Telehealth: Payer: Self-pay | Admitting: *Deleted

## 2018-04-16 DIAGNOSIS — C4362 Malignant melanoma of left upper limb, including shoulder: Secondary | ICD-10-CM

## 2018-04-16 MED ORDER — LEVOTHYROXINE SODIUM 50 MCG PO TABS: 50.0000 ug | ORAL_TABLET | Freq: Every day | ORAL | 6 refills | Status: DC

## 2018-04-16 NOTE — Telephone Encounter (Addendum)
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  Need to start Synthroid 50 mcg po q day.  Please call this in!!  Brittany Williamson

## 2018-04-29 ENCOUNTER — Ambulatory Visit (HOSPITAL_COMMUNITY)
Admission: RE | Admit: 2018-04-29 | Discharge: 2018-04-29 | Disposition: A | Discharge status: Home / Self Care | Payer: Medicare HMO | Source: Ambulatory Visit | Attending: Hematology & Oncology | Admitting: Hematology & Oncology

## 2018-04-29 DIAGNOSIS — C4362 Malignant melanoma of left upper limb, including shoulder: Secondary | ICD-10-CM | POA: Insufficient documentation

## 2018-04-29 LAB — GLUCOSE, CAPILLARY: Glucose-Capillary: 120 mg/dL — ABNORMAL HIGH (ref 70–99)

## 2018-04-29 MED ORDER — FLUDEOXYGLUCOSE F - 18 (FDG) INJECTION
12.4500 | Freq: Once | INTRAVENOUS | Status: AC | PRN
  Administered 2018-04-29: 12.45 via INTRAVENOUS

## 2018-04-30 ENCOUNTER — Telehealth: Payer: Self-pay | Admitting: *Deleted

## 2018-04-30 NOTE — Telephone Encounter (Signed)
No melanoma noted on PET scan per Dr. Marin Olp. Called to patient. Patient stated that she has a few bumps on her arms but not very many.  Look like pimples.  Told patient to call us if rash continues to get worse.  Told her that there is a rash that comes from [place] and to call us ASAP if gets any worse.

## 2018-05-09 ENCOUNTER — Inpatient Hospital Stay: Payer: Medicare HMO | Attending: Hematology & Oncology

## 2018-05-09 DIAGNOSIS — Z5112 Encounter for antineoplastic immunotherapy: Secondary | ICD-10-CM | POA: Diagnosis present

## 2018-05-09 DIAGNOSIS — C4362 Malignant melanoma of left upper limb, including shoulder: Secondary | ICD-10-CM | POA: Insufficient documentation

## 2018-05-09 DIAGNOSIS — Z79899 Other long term (current) drug therapy: Secondary | ICD-10-CM | POA: Diagnosis not present

## 2018-05-09 DIAGNOSIS — E039 Hypothyroidism, unspecified: Secondary | ICD-10-CM | POA: Insufficient documentation

## 2018-05-09 LAB — CBC WITH DIFFERENTIAL (CANCER CENTER ONLY)
ABS IMMATURE GRANULOCYTES: 0.03 10*3/uL (ref 0.00–0.07)
BASOS ABS: 0 10*3/uL (ref 0.0–0.1)
BASOS PCT: 0 %
Eosinophils Absolute: 0.2 10*3/uL (ref 0.0–0.5)
Eosinophils Relative: 2 %
HCT: 39.7 % (ref 36.0–46.0)
HEMOGLOBIN: 12.5 g/dL (ref 12.0–15.0)
IMMATURE GRANULOCYTES: 0 %
Lymphocytes Relative: 38 %
Lymphs Abs: 3.2 10*3/uL (ref 0.7–4.0)
MCH: 26.4 pg (ref 26.0–34.0)
MCHC: 31.5 g/dL (ref 30.0–36.0)
MCV: 83.9 fL (ref 80.0–100.0)
MONO ABS: 0.6 10*3/uL (ref 0.1–1.0)
Monocytes Relative: 7 %
NEUTROS ABS: 4.4 10*3/uL (ref 1.7–7.7)
NEUTROS PCT: 53 %
PLATELETS: 290 10*3/uL (ref 150–400)
RBC: 4.73 MIL/uL (ref 3.87–5.11)
RDW: 16 % — ABNORMAL HIGH (ref 11.5–15.5)
WBC: 8.5 10*3/uL (ref 4.0–10.5)
nRBC: 0 % (ref 0.0–0.2)

## 2018-05-09 LAB — CMP (CANCER CENTER ONLY)
ALBUMIN: 3.6 g/dL (ref 3.5–5.0)
ALK PHOS: 72 U/L (ref 26–84)
ALT: 21 U/L (ref 10–47)
AST: 25 U/L (ref 11–38)
Anion gap: 6 (ref 5–15)
BUN: 21 mg/dL (ref 7–22)
CO2: 26 mmol/L (ref 18–33)
CREATININE: 0.9 mg/dL (ref 0.60–1.20)
Calcium: 9.6 mg/dL (ref 8.0–10.3)
Chloride: 109 mmol/L — ABNORMAL HIGH (ref 98–108)
GLUCOSE: 112 mg/dL (ref 73–118)
Potassium: 4.1 mmol/L (ref 3.3–4.7)
SODIUM: 141 mmol/L (ref 128–145)
TOTAL PROTEIN: 7.4 g/dL (ref 6.4–8.1)
Total Bilirubin: 0.6 mg/dL (ref 0.2–1.6)

## 2018-05-09 LAB — LACTATE DEHYDROGENASE: LDH: 171 U/L (ref 98–192)

## 2018-05-12 ENCOUNTER — Encounter: Payer: Self-pay | Admitting: Hematology & Oncology

## 2018-05-12 ENCOUNTER — Inpatient Hospital Stay: Payer: Medicare HMO

## 2018-05-12 ENCOUNTER — Inpatient Hospital Stay: Payer: Medicare HMO | Admitting: Hematology & Oncology

## 2018-05-12 ENCOUNTER — Telehealth: Payer: Self-pay | Admitting: Hematology & Oncology

## 2018-05-12 ENCOUNTER — Other Ambulatory Visit: Payer: Self-pay

## 2018-05-12 VITALS — BP 153/72 | HR 91 | Temp 97.5°F | Resp 18 | Wt 252.0 lb

## 2018-05-12 DIAGNOSIS — F329 Major depressive disorder, single episode, unspecified: Secondary | ICD-10-CM | POA: Diagnosis not present

## 2018-05-12 DIAGNOSIS — Z5112 Encounter for antineoplastic immunotherapy: Secondary | ICD-10-CM | POA: Diagnosis not present

## 2018-05-12 DIAGNOSIS — E039 Hypothyroidism, unspecified: Secondary | ICD-10-CM

## 2018-05-12 DIAGNOSIS — C4362 Malignant melanoma of left upper limb, including shoulder: Secondary | ICD-10-CM

## 2018-05-12 LAB — TSH: TSH: 16.928 u[IU]/mL — AB (ref 0.308–3.960)

## 2018-05-12 MED ORDER — SODIUM CHLORIDE 0.9 % IV SOLN
480.0000 mg | Freq: Once | INTRAVENOUS | Status: AC
  Administered 2018-05-12: 480 mg via INTRAVENOUS
  Filled 2018-05-12: qty 48

## 2018-05-12 MED ORDER — SODIUM CHLORIDE 0.9 % IV SOLN
Freq: Once | INTRAVENOUS | Status: AC
  Administered 2018-05-12: 10:00:00 via INTRAVENOUS
  Filled 2018-05-12: qty 250

## 2018-05-12 MED ORDER — HEPARIN SOD (PORK) LOCK FLUSH 100 UNIT/ML IV SOLN
500.0000 [IU] | Freq: Once | INTRAVENOUS | Status: AC | PRN
  Administered 2018-05-12: 500 [IU]
  Filled 2018-05-12: qty 5

## 2018-05-12 MED ORDER — SODIUM CHLORIDE 0.9% FLUSH
10.0000 mL | INTRAVENOUS | Status: DC | PRN
  Administered 2018-05-12: 10 mL
  Filled 2018-05-12: qty 10

## 2018-05-12 NOTE — Telephone Encounter (Signed)
Appts scheduled avs/calendar printed per 11/11 los

## 2018-05-12 NOTE — Progress Notes (Signed)
Hematology and Oncology Follow Up Visit  [name] [id number] 1946-03-23 72 y.o. 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  She is doing a little bit better.  The last time I saw her, there were 4 deaths in the family.  This definitely took its toll on her.  We did do a PET scan on her.  This was done on 04/29/2018.  There is no evidence of recurrent/metastatic melanoma on the PET scan.  She also has hypothyroidism.  I am sure this is from the [place].  Only last saw her, her TSH was 16.  We put her on Synthroid.  We are rechecking her TSH level today.  She has had no rashes.  She has had no bleeding.  There has been no cough or shortness of breath.  There is been no headache.  She we will have a nice Thanksgiving with family.  She is worried about her weight.  Her weight has not gone down.  I told her that if the weight is from low thyroid, it might take 3 or 4 months before we start to see the weight adjust.  Overall, her performance status is ECOG 1.   Medications:  Allergies as of 05/12/2018      Reactions   Bee Venom Other (See Comments)   Penicillins Rash   Beeswax Other (See Comments)   Bee stings. Bee stings. Bee stings. Bee stings.      Medication List        Accurate as of 05/12/18  9:18 AM. Always use your most recent med list.          atorvastatin 20 MG tablet Commonly known as:  LIPITOR Take 20 mg by mouth daily.   butalbital-aspirin-caffeine 50-325-40 MG capsule Commonly known as:  FIORINAL Take 1 capsule by mouth every 6 (six) hours as needed for headache.   carvedilol 3.125 MG tablet Commonly known as:  COREG Take 3.125 mg by mouth 2 (two) times daily.   Diethylpropion HCl CR 75  MG Tb24 Take 75 mg by mouth daily.   doxycycline 100 MG tablet Commonly known as:  VIBRA-TABS Take 1 tablet (100 mg total) by mouth 2 (two) times daily.   DULoxetine 30 MG capsule Commonly known as:  CYMBALTA Take 30 mg by mouth daily.   levothyroxine 50 MCG tablet Commonly known as:  SYNTHROID, LEVOTHROID Take 1 tablet (50 mcg total) by mouth daily before breakfast.   lidocaine-prilocaine cream Commonly known as:  EMLA Apply 1 application topically as needed. Apply 1 hr prior to pac access and cover with plastic wrap   nystatin cream Commonly known as:  MYCOSTATIN Apply 1 application topically 2 (two) times daily.   valsartan-hydrochlorothiazide 80-12.5 MG tablet Commonly known as:  DIOVAN-HCT       Allergies:  Allergies  Allergen Reactions  . Bee Venom Other (See Comments)  . Penicillins Rash  . Beeswax Other (See Comments)    Bee stings. Bee stings.  Bee stings. Bee stings.    Past Medical History, Surgical history, Social history, and Family History were reviewed and updated.  Review of Systems: Review of Systems  Constitutional: Negative.   HENT: Negative.   Eyes: Negative.   Respiratory: Negative.   Cardiovascular: Negative.  Gastrointestinal: Negative.   Genitourinary: Negative.   Musculoskeletal: Negative.   Skin: Negative.   Neurological: Negative.   Endo/Heme/Allergies: Negative.   Psychiatric/Behavioral: Positive for depression.     Physical Exam:  weight is 252 lb (114.3 kg). Her oral temperature is 97.5 F (36.4 C) (abnormal). Her blood pressure is 153/72 (abnormal) and her pulse is 91. Her respiration is 18 and oxygen saturation is 97%.   Wt Readings from Last 3 Encounters:  05/12/18 252 lb (114.3 kg)  04/14/18 250 lb (113.4 kg)  03/17/18 249 lb (112.9 kg)    Physical Exam  Constitutional: She is oriented to person, place, and time.  HENT:  Head: Normocephalic and atraumatic.  Mouth/Throat: Oropharynx is clear and moist.    Eyes: Pupils are equal, round, and reactive to light. EOM are normal.  Neck: Normal range of motion.  Cardiovascular: Normal rate, regular rhythm and normal heart sounds.  Pulmonary/Chest: Effort normal and breath sounds normal.  Abdominal: Soft. Bowel sounds are normal.  Musculoskeletal: Normal range of motion. She exhibits no edema, tenderness or deformity.  Lymphadenopathy:    She has no cervical adenopathy.  Neurological: She is alert and oriented to person, place, and time.  Skin: Skin is warm and dry. No rash noted. No erythema.  Psychiatric: She has a normal mood and affect. Her behavior is normal. Judgment and thought content normal.  Vitals reviewed.    Lab Results  Component Value Date   WBC 8.5 05/09/2018   HGB 12.5 05/09/2018   HCT 39.7 05/09/2018   MCV 83.9 05/09/2018   PLT 290 05/09/2018   No results found for: FERRITIN, IRON, TIBC, UIBC, IRONPCTSAT Lab Results  Component Value Date   RBC 4.73 05/09/2018   No results found for: KPAFRELGTCHN, LAMBDASER, KAPLAMBRATIO No results found for: IGGSERUM, IGA, IGMSERUM No results found for: Odetta Pink, SPEI   Chemistry      Component Value Date/Time   NA 141 05/09/2018 1050   K 4.1 05/09/2018 1050   CL 109 (H) 05/09/2018 1050   CO2 26 05/09/2018 1050   BUN 21 05/09/2018 1050   CREATININE 0.90 05/09/2018 1050      Component Value Date/Time   CALCIUM 9.6 05/09/2018 1050   ALKPHOS 72 05/09/2018 1050   AST 25 05/09/2018 1050   ALT 21 05/09/2018 1050   BILITOT 0.6 05/09/2018 1050      Impression and Plan: Ms. Brittany Williamson is a very pleasant 72 yo caucasian female with 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   She had a wide local excision and left axillary node excision on 11/05/2017.  We will go ahead and proceed with her sixth cycle of Opdivo.  Again, the PET scan is quite reassuring.  We will see  what her thyroid levels are.  She was drinking a Coke when we saw her this morning.  I told her that this probably is not going to help her lose weight.  I recommended water or juice or something that is not sugar filled.  We will plan to get her back in another 4 weeks.  Volanda Napoleon, MD 11/11/20199:18 AM

## 2018-05-12 NOTE — Patient Instructions (Signed)
Nivolumab injection What is this medicine? NIVOLUMAB (nye VOL ue mab) is a monoclonal antibody. It is used to treat melanoma, lung cancer, kidney cancer, head and neck cancer, Hodgkin lymphoma, urothelial cancer, colon cancer, and liver cancer. This medicine may be used for other purposes; ask your health care provider or pharmacist if you have questions. COMMON BRAND NAME(S): Opdivo What should I tell my health care provider before I take this medicine? They need to know if you have any of these conditions: -diabetes -immune system problems -kidney disease -liver disease -lung disease -organ transplant -stomach or intestine problems -thyroid disease -an unusual or allergic reaction to nivolumab, other medicines, foods, dyes, or preservatives -pregnant or trying to get pregnant -breast-feeding How should I use this medicine? This medicine is for infusion into a vein. It is given by a health care professional in a hospital or clinic setting. A special MedGuide will be given to you before each treatment. Be sure to read this information carefully each time. Talk to your pediatrician regarding the use of this medicine in children. While this drug may be prescribed for children as young as 12 years for selected conditions, precautions do apply. Overdosage: If you think you have taken too much of this medicine contact a poison control center or emergency room at once. NOTE: This medicine is only for you. Do not share this medicine with others. What if I miss a dose? It is important not to miss your dose. Call your doctor or health care professional if you are unable to keep an appointment. What may interact with this medicine? Interactions have not been studied. Give your health care provider a list of all the medicines, herbs, non-prescription drugs, or dietary supplements you use. Also tell them if you smoke, drink alcohol, or use illegal drugs. Some items may interact with your  medicine. This list may not describe all possible interactions. Give your health care provider a list of all the medicines, herbs, non-prescription drugs, or dietary supplements you use. Also tell them if you smoke, drink alcohol, or use illegal drugs. Some items may interact with your medicine. What should I watch for while using this medicine? This drug may make you feel generally unwell. Continue your course of treatment even though you feel ill unless your doctor tells you to stop. You may need blood work done while you are taking this medicine. Do not become pregnant while taking this medicine or for 5 months after stopping it. Women should inform their doctor if they wish to become pregnant or think they might be pregnant. There is a potential for serious side effects to an unborn child. Talk to your health care professional or pharmacist for more information. Do not breast-feed an infant while taking this medicine. What side effects may I notice from receiving this medicine? Side effects that you should report to your doctor or health care professional as soon as possible: -allergic reactions like skin rash, itching or hives, swelling of the face, lips, or tongue -black, tarry stools -blood in the urine -bloody or watery diarrhea -changes in vision -change in sex drive -changes in emotions or moods -chest pain -confusion -cough -decreased appetite -diarrhea -facial flushing -feeling faint or lightheaded -fever, chills -hair loss -hallucination, loss of contact with reality -headache -irritable -joint pain -loss of memory -muscle pain -muscle weakness -seizures -shortness of breath -signs and symptoms of high blood sugar such as dizziness; dry mouth; dry skin; fruity breath; nausea; stomach pain; increased hunger or thirst; increased   urination -signs and symptoms of kidney injury like trouble passing urine or change in the amount of urine -signs and symptoms of liver injury  like dark yellow or brown urine; general ill feeling or flu-like symptoms; light-colored stools; loss of appetite; nausea; right upper belly pain; unusually weak or tired; yellowing of the eyes or skin -stiff neck -swelling of the ankles, feet, hands -weight gain Side effects that usually do not require medical attention (report to your doctor or health care professional if they continue or are bothersome): -bone pain -constipation -tiredness -vomiting This list may not describe all possible side effects. Call your doctor for medical advice about side effects. You may report side effects to FDA at 1-800-FDA-1088. Where should I keep my medicine? This drug is given in a hospital or clinic and will not be stored at home. NOTE: This sheet is a summary. It may not cover all possible information. If you have questions about this medicine, talk to your doctor, pharmacist, or health care provider.  2018 Elsevier/Gold Standard (2016-03-26 17:49:34)  

## 2018-06-06 ENCOUNTER — Inpatient Hospital Stay: Payer: Medicare HMO | Attending: Hematology & Oncology

## 2018-06-06 DIAGNOSIS — M7989 Other specified soft tissue disorders: Secondary | ICD-10-CM | POA: Insufficient documentation

## 2018-06-06 DIAGNOSIS — C4362 Malignant melanoma of left upper limb, including shoulder: Secondary | ICD-10-CM | POA: Insufficient documentation

## 2018-06-06 DIAGNOSIS — R21 Rash and other nonspecific skin eruption: Secondary | ICD-10-CM | POA: Insufficient documentation

## 2018-06-06 DIAGNOSIS — Z5112 Encounter for antineoplastic immunotherapy: Secondary | ICD-10-CM | POA: Diagnosis not present

## 2018-06-06 DIAGNOSIS — E039 Hypothyroidism, unspecified: Secondary | ICD-10-CM | POA: Diagnosis not present

## 2018-06-06 LAB — CBC WITH DIFFERENTIAL (CANCER CENTER ONLY)
Abs Immature Granulocytes: 0.03 10*3/uL (ref 0.00–0.07)
BASOS ABS: 0.1 10*3/uL (ref 0.0–0.1)
BASOS PCT: 1 %
EOS ABS: 0.2 10*3/uL (ref 0.0–0.5)
EOS PCT: 2 %
HEMATOCRIT: 39.9 % (ref 36.0–46.0)
Hemoglobin: 12.7 g/dL (ref 12.0–15.0)
Immature Granulocytes: 0 %
Lymphocytes Relative: 34 %
Lymphs Abs: 3.6 10*3/uL (ref 0.7–4.0)
MCH: 27.5 pg (ref 26.0–34.0)
MCHC: 31.8 g/dL (ref 30.0–36.0)
MCV: 86.6 fL (ref 80.0–100.0)
Monocytes Absolute: 0.9 10*3/uL (ref 0.1–1.0)
Monocytes Relative: 8 %
NRBC: 0 % (ref 0.0–0.2)
Neutro Abs: 5.8 10*3/uL (ref 1.7–7.7)
Neutrophils Relative %: 55 %
PLATELETS: 286 10*3/uL (ref 150–400)
RBC: 4.61 MIL/uL (ref 3.87–5.11)
RDW: 15.6 % — AB (ref 11.5–15.5)
WBC Count: 10.5 10*3/uL (ref 4.0–10.5)

## 2018-06-06 LAB — CMP (CANCER CENTER ONLY)
ALBUMIN: 4.4 g/dL (ref 3.5–5.0)
ALT: 17 U/L (ref 0–44)
ANION GAP: 10 (ref 5–15)
AST: 16 U/L (ref 15–41)
Alkaline Phosphatase: 73 U/L (ref 38–126)
BUN: 19 mg/dL (ref 8–23)
CO2: 26 mmol/L (ref 22–32)
Calcium: 9.6 mg/dL (ref 8.9–10.3)
Chloride: 104 mmol/L (ref 98–111)
Creatinine: 0.86 mg/dL (ref 0.44–1.00)
GFR, Est AFR Am: >60 mL/min (ref >60)
GFR, Estimated: >60 mL/min (ref >60)
GLUCOSE: 102 mg/dL — AB (ref 70–99)
POTASSIUM: 4.1 mmol/L (ref 3.5–5.1)
SODIUM: 140 mmol/L (ref 135–145)
Total Bilirubin: 0.4 mg/dL (ref 0.3–1.2)
Total Protein: 7.2 g/dL (ref 6.5–8.1)

## 2018-06-09 ENCOUNTER — Telehealth: Payer: Self-pay | Admitting: Hematology & Oncology

## 2018-06-09 ENCOUNTER — Encounter: Payer: Self-pay | Admitting: Family

## 2018-06-09 ENCOUNTER — Other Ambulatory Visit: Payer: Self-pay

## 2018-06-09 ENCOUNTER — Inpatient Hospital Stay (HOSPITAL_BASED_OUTPATIENT_CLINIC_OR_DEPARTMENT_OTHER): Payer: Medicare HMO | Admitting: Family

## 2018-06-09 ENCOUNTER — Other Ambulatory Visit: Payer: Medicare HMO

## 2018-06-09 ENCOUNTER — Inpatient Hospital Stay: Payer: Medicare HMO

## 2018-06-09 VITALS — BP 157/89 | HR 87 | Temp 97.8°F | Resp 18 | Ht 65.0 in | Wt 252.0 lb

## 2018-06-09 DIAGNOSIS — M7989 Other specified soft tissue disorders: Secondary | ICD-10-CM

## 2018-06-09 DIAGNOSIS — C4362 Malignant melanoma of left upper limb, including shoulder: Secondary | ICD-10-CM | POA: Diagnosis not present

## 2018-06-09 DIAGNOSIS — R21 Rash and other nonspecific skin eruption: Secondary | ICD-10-CM | POA: Diagnosis not present

## 2018-06-09 DIAGNOSIS — Z5112 Encounter for antineoplastic immunotherapy: Secondary | ICD-10-CM | POA: Diagnosis not present

## 2018-06-09 DIAGNOSIS — E039 Hypothyroidism, unspecified: Secondary | ICD-10-CM

## 2018-06-09 DIAGNOSIS — E032 Hypothyroidism due to medicaments and other exogenous substances: Secondary | ICD-10-CM

## 2018-06-09 LAB — TSH: TSH: 13.047 u[IU]/mL — AB (ref 0.308–3.960)

## 2018-06-09 MED ORDER — SODIUM CHLORIDE 0.9% FLUSH
10.0000 mL | INTRAVENOUS | Status: DC | PRN
  Administered 2018-06-09: 10 mL
  Filled 2018-06-09: qty 10

## 2018-06-09 MED ORDER — METHYLPREDNISOLONE SODIUM SUCC 125 MG IJ SOLR
INTRAMUSCULAR | Status: AC
  Filled 2018-06-09: qty 2

## 2018-06-09 MED ORDER — SODIUM CHLORIDE 0.9 % IV SOLN
Freq: Once | INTRAVENOUS | Status: AC
  Administered 2018-06-09: 15:00:00 via INTRAVENOUS
  Filled 2018-06-09: qty 250

## 2018-06-09 MED ORDER — LEVOTHYROXINE SODIUM 75 MCG PO TABS: 75.0000 ug | ORAL_TABLET | Freq: Every day | ORAL | 4 refills | Status: DC

## 2018-06-09 MED ORDER — HEPARIN SOD (PORK) LOCK FLUSH 100 UNIT/ML IV SOLN
500.0000 [IU] | Freq: Once | INTRAVENOUS | Status: AC | PRN
  Administered 2018-06-09: 500 [IU]
  Filled 2018-06-09: qty 5

## 2018-06-09 MED ORDER — SODIUM CHLORIDE 0.9 % IV SOLN
480.0000 mg | Freq: Once | INTRAVENOUS | Status: AC
  Administered 2018-06-09: 480 mg via INTRAVENOUS
  Filled 2018-06-09: qty 48

## 2018-06-09 MED ORDER — METHYLPREDNISOLONE SODIUM SUCC 125 MG IJ SOLR
60.0000 mg | Freq: Once | INTRAMUSCULAR | Status: AC
  Administered 2018-06-09: 60 mg via INTRAVENOUS

## 2018-06-09 NOTE — Progress Notes (Signed)
Hematology and Oncology Follow Up Visit  Brittany Williamson [id number] 17-Jul-1945 72 y.o. 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 She is doing well but has had a light raised red rash on her back, chest, arms and legs. It gets a little itchy but she states this is tolerable.  Her TSH is now 13.047 and we will have her increase her Synthroid to 75 mcg daily.  She states that her fatigue is improving.  No fever, chills, n/v, cough, dizziness, SOB, chest pain, palpitations, abdominal pain or changes in bowel or bladder habits.  No tenderness, numbness or tingling in her extremities. The swelling in her legs is stable and pedal pulses are 2 +.  She has maintained a good appetite and is staying well hydrated. Her weight is stable.  No lymphadenopathy noted on exam.  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   ECOG Performance Status: 1 - Symptomatic but completely ambulatory  Medications:  Allergies as of 06/09/2018      Reactions   Bee Venom Other (See Comments)   Penicillins Rash   Beeswax Other (See Comments)   Bee stings. Bee stings. Bee stings. Bee stings.      Medication List        Accurate as of 06/09/18  1:50 PM. Always use your most recent med list.          atorvastatin 20 MG tablet Commonly known as:  LIPITOR Take 20 mg by mouth daily.   butalbital-aspirin-caffeine 50-325-40 MG capsule Commonly known as:  FIORINAL Take 1 capsule by mouth every 6 (six) hours as needed for headache.   carvedilol 3.125 MG tablet Commonly known as:  COREG Take 3.125 mg by mouth 2 (two) times daily.   Diethylpropion HCl CR 75 MG Tb24 Take 75 mg by mouth daily.   doxycycline 100  MG tablet Commonly known as:  VIBRA-TABS Take 1 tablet (100 mg total) by mouth 2 (two) times daily.   DULoxetine 30 MG capsule Commonly known as:  CYMBALTA Take 30 mg by mouth daily.   levothyroxine 50 MCG tablet Commonly known as:  SYNTHROID, LEVOTHROID Take 1 tablet (50 mcg total) by mouth daily before breakfast.   lidocaine-prilocaine cream Commonly known as:  EMLA Apply 1 application topically as needed. Apply 1 hr prior to pac access and cover with plastic wrap   nystatin cream Commonly known as:  MYCOSTATIN Apply 1 application topically 2 (two) times daily.   valsartan-hydrochlorothiazide 80-12.5 MG tablet Commonly known as:  DIOVAN-HCT       Allergies:  Allergies  Allergen Reactions  . Bee Venom Other (See Comments)  . Penicillins Rash  . Beeswax Other (See Comments)    Bee stings. Bee stings.  Bee stings. Bee stings.    Past Medical History, Surgical history, Social history, and Family History were reviewed and updated.  Review of Systems: All other 10 point review of systems is negative.   Physical Exam:  height is 5\' 5"  (1.651 m) and weight is 252 lb (114.3 kg). Her oral temperature is 97.8 F (36.6 C). Her blood pressure is 157/89 (abnormal) and her pulse is 87. Her respiration is 18 and oxygen saturation is 97%.  Wt Readings from Last 3 Encounters:  06/09/18 252 lb (114.3 kg)  05/12/18 252 lb (114.3 kg)  04/14/18 250 lb (113.4 kg)    Ocular: Sclerae unicteric, pupils equal, round and reactive to light Ear-nose-throat: Oropharynx clear, dentition fair Lymphatic: No cervical, supraclavicular or axillary adenopathy Lungs no rales or rhonchi, good excursion bilaterally Heart regular rate and rhythm, no murmur appreciated Abd soft, nontender, positive bowel sounds, no liver or spleen tip palpated on exam, no fluid wave  MSK no focal spinal tenderness, no joint edema Neuro: non-focal, well-oriented, appropriate affect Breasts: Deferred   Lab  Results  Component Value Date   WBC 10.5 06/06/2018   HGB 12.7 06/06/2018   HCT 39.9 06/06/2018   MCV 86.6 06/06/2018   PLT 286 06/06/2018   No results found for: FERRITIN, IRON, TIBC, UIBC, IRONPCTSAT Lab Results  Component Value Date   RBC 4.61 06/06/2018   No results found for: KPAFRELGTCHN, LAMBDASER, KAPLAMBRATIO No results found for: IGGSERUM, IGA, IGMSERUM No results found for: Odetta Pink, SPEI   Chemistry      Component Value Date/Time   NA 140 06/06/2018 1342   K 4.1 06/06/2018 1342   CL 104 06/06/2018 1342   CO2 26 06/06/2018 1342   BUN 19 06/06/2018 1342   CREATININE 0.86 06/06/2018 1342      Component Value Date/Time   CALCIUM 9.6 06/06/2018 1342   ALKPHOS 73 06/06/2018 1342   AST 16 06/06/2018 1342   ALT 17 06/06/2018 1342   BILITOT 0.4 06/06/2018 1342       Impression and Plan: Brittany Williamson is a very pleasant 72 yo caucasian female with stage IIIa (T2N1M0) nodular melanoma of the left shoulder. Her left should excision site has healed nicely.  She is tolerating treatment with Opdivo nicely but has developed hypothyroidism and a slight rash.  Synthroid was increased to 75 mcg Po daily.  We will give her Solumedrol 60 mg IV once today prior to treatment and hopefully this will help dry out her rash.  We will see her again in another 4 weeks.  She will contact our office with any questions or concerns. We can certainly see her sooner if need be.   Laverna Peace, NP 12/9/20191:50 PM

## 2018-06-09 NOTE — Telephone Encounter (Signed)
Appointments scheduled/LMVM for patient with new date/time/ letter/calendar mailed per 12/9 los

## 2018-06-09 NOTE — Patient Instructions (Signed)
Nivolumab injection What is this medicine? NIVOLUMAB (nye VOL ue mab) is a monoclonal antibody. It is used to treat melanoma, lung cancer, kidney cancer, head and neck cancer, Hodgkin lymphoma, urothelial cancer, colon cancer, and liver cancer. This medicine may be used for other purposes; ask your health care provider or pharmacist if you have questions. COMMON BRAND NAME(S): Opdivo What should I tell my health care provider before I take this medicine? They need to know if you have any of these conditions: -diabetes -immune system problems -kidney disease -liver disease -lung disease -organ transplant -stomach or intestine problems -thyroid disease -an unusual or allergic reaction to nivolumab, other medicines, foods, dyes, or preservatives -pregnant or trying to get pregnant -breast-feeding How should I use this medicine? This medicine is for infusion into a vein. It is given by a health care professional in a hospital or clinic setting. A special MedGuide will be given to you before each treatment. Be sure to read this information carefully each time. Talk to your pediatrician regarding the use of this medicine in children. While this drug may be prescribed for children as young as 12 years for selected conditions, precautions do apply. Overdosage: If you think you have taken too much of this medicine contact a poison control center or emergency room at once. NOTE: This medicine is only for you. Do not share this medicine with others. What if I miss a dose? It is important not to miss your dose. Call your doctor or health care professional if you are unable to keep an appointment. What may interact with this medicine? Interactions have not been studied. Give your health care provider a list of all the medicines, herbs, non-prescription drugs, or dietary supplements you use. Also tell them if you smoke, drink alcohol, or use illegal drugs. Some items may interact with your  medicine. This list may not describe all possible interactions. Give your health care provider a list of all the medicines, herbs, non-prescription drugs, or dietary supplements you use. Also tell them if you smoke, drink alcohol, or use illegal drugs. Some items may interact with your medicine. What should I watch for while using this medicine? This drug may make you feel generally unwell. Continue your course of treatment even though you feel ill unless your doctor tells you to stop. You may need blood work done while you are taking this medicine. Do not become pregnant while taking this medicine or for 5 months after stopping it. Women should inform their doctor if they wish to become pregnant or think they might be pregnant. There is a potential for serious side effects to an unborn child. Talk to your health care professional or pharmacist for more information. Do not breast-feed an infant while taking this medicine. What side effects may I notice from receiving this medicine? Side effects that you should report to your doctor or health care professional as soon as possible: -allergic reactions like skin rash, itching or hives, swelling of the face, lips, or tongue -black, tarry stools -blood in the urine -bloody or watery diarrhea -changes in vision -change in sex drive -changes in emotions or moods -chest pain -confusion -cough -decreased appetite -diarrhea -facial flushing -feeling faint or lightheaded -fever, chills -hair loss -hallucination, loss of contact with reality -headache -irritable -joint pain -loss of memory -muscle pain -muscle weakness -seizures -shortness of breath -signs and symptoms of high blood sugar such as dizziness; dry mouth; dry skin; fruity breath; nausea; stomach pain; increased hunger or thirst; increased   urination -signs and symptoms of kidney injury like trouble passing urine or change in the amount of urine -signs and symptoms of liver injury  like dark yellow or brown urine; general ill feeling or flu-like symptoms; light-colored stools; loss of appetite; nausea; right upper belly pain; unusually weak or tired; yellowing of the eyes or skin -stiff neck -swelling of the ankles, feet, hands -weight gain Side effects that usually do not require medical attention (report to your doctor or health care professional if they continue or are bothersome): -bone pain -constipation -tiredness -vomiting This list may not describe all possible side effects. Call your doctor for medical advice about side effects. You may report side effects to FDA at 1-800-FDA-1088. Where should I keep my medicine? This drug is given in a hospital or clinic and will not be stored at home. NOTE: This sheet is a summary. It may not cover all possible information. If you have questions about this medicine, talk to your doctor, pharmacist, or health care provider.  2018 Elsevier/Gold Standard (2016-03-26 17:49:34)  

## 2018-07-04 ENCOUNTER — Inpatient Hospital Stay: Payer: Medicare HMO | Attending: Hematology & Oncology

## 2018-07-04 DIAGNOSIS — C4362 Malignant melanoma of left upper limb, including shoulder: Secondary | ICD-10-CM | POA: Insufficient documentation

## 2018-07-04 DIAGNOSIS — Z5112 Encounter for antineoplastic immunotherapy: Secondary | ICD-10-CM | POA: Insufficient documentation

## 2018-07-04 DIAGNOSIS — Z79899 Other long term (current) drug therapy: Secondary | ICD-10-CM | POA: Insufficient documentation

## 2018-07-04 DIAGNOSIS — E032 Hypothyroidism due to medicaments and other exogenous substances: Secondary | ICD-10-CM

## 2018-07-04 LAB — CMP (CANCER CENTER ONLY)
ALT: 17 U/L (ref 0–44)
AST: 15 U/L (ref 15–41)
Albumin: 4.4 g/dL (ref 3.5–5.0)
Alkaline Phosphatase: 84 U/L (ref 38–126)
Anion gap: 8 (ref 5–15)
BUN: 17 mg/dL (ref 8–23)
CO2: 23 mmol/L (ref 22–32)
Calcium: 9.6 mg/dL (ref 8.9–10.3)
Chloride: 107 mmol/L (ref 98–111)
Creatinine: 0.84 mg/dL (ref 0.44–1.00)
GFR, Est AFR Am: >60 mL/min (ref >60)
Glucose, Bld: 110 mg/dL — ABNORMAL HIGH (ref 70–99)
Potassium: 4 mmol/L (ref 3.5–5.1)
Sodium: 138 mmol/L (ref 135–145)
Total Bilirubin: 0.4 mg/dL (ref 0.3–1.2)
Total Protein: 7.5 g/dL (ref 6.5–8.1)

## 2018-07-04 LAB — CBC WITH DIFFERENTIAL (CANCER CENTER ONLY)
ABS IMMATURE GRANULOCYTES: 0.02 10*3/uL (ref 0.00–0.07)
BASOS PCT: 0 %
Basophils Absolute: 0 10*3/uL (ref 0.0–0.1)
Eosinophils Absolute: 0.2 10*3/uL (ref 0.0–0.5)
Eosinophils Relative: 2 %
HCT: 40.6 % (ref 36.0–46.0)
Hemoglobin: 13.1 g/dL (ref 12.0–15.0)
Immature Granulocytes: 0 %
Lymphocytes Relative: 34 %
Lymphs Abs: 3.3 10*3/uL (ref 0.7–4.0)
MCH: 27.6 pg (ref 26.0–34.0)
MCHC: 32.3 g/dL (ref 30.0–36.0)
MCV: 85.5 fL (ref 80.0–100.0)
MONO ABS: 0.7 10*3/uL (ref 0.1–1.0)
Monocytes Relative: 7 %
Neutro Abs: 5.4 10*3/uL (ref 1.7–7.7)
Neutrophils Relative %: 57 %
Platelet Count: 312 10*3/uL (ref 150–400)
RBC: 4.75 MIL/uL (ref 3.87–5.11)
RDW: 14.6 % (ref 11.5–15.5)
WBC Count: 9.6 10*3/uL (ref 4.0–10.5)
nRBC: 0 % (ref 0.0–0.2)

## 2018-07-07 ENCOUNTER — Ambulatory Visit: Payer: Medicare HMO | Admitting: Family

## 2018-07-07 ENCOUNTER — Ambulatory Visit: Payer: Medicare HMO

## 2018-07-07 LAB — TSH: TSH: 3.652 u[IU]/mL (ref 0.308–3.960)

## 2018-07-08 ENCOUNTER — Inpatient Hospital Stay (HOSPITAL_BASED_OUTPATIENT_CLINIC_OR_DEPARTMENT_OTHER): Payer: Medicare HMO | Admitting: Family

## 2018-07-08 ENCOUNTER — Inpatient Hospital Stay: Payer: Medicare HMO

## 2018-07-08 ENCOUNTER — Encounter: Payer: Self-pay | Admitting: Family

## 2018-07-08 ENCOUNTER — Other Ambulatory Visit: Payer: Self-pay

## 2018-07-08 VITALS — BP 153/78 | HR 87 | Temp 98.0°F | Resp 18 | Wt 247.0 lb

## 2018-07-08 DIAGNOSIS — C4362 Malignant melanoma of left upper limb, including shoulder: Secondary | ICD-10-CM

## 2018-07-08 DIAGNOSIS — E032 Hypothyroidism due to medicaments and other exogenous substances: Secondary | ICD-10-CM

## 2018-07-08 DIAGNOSIS — Z5112 Encounter for antineoplastic immunotherapy: Secondary | ICD-10-CM | POA: Diagnosis not present

## 2018-07-08 MED ORDER — SODIUM CHLORIDE 0.9% FLUSH
10.0000 mL | INTRAVENOUS | Status: DC | PRN
  Administered 2018-07-08: 10 mL
  Filled 2018-07-08: qty 10

## 2018-07-08 MED ORDER — HEPARIN SOD (PORK) LOCK FLUSH 100 UNIT/ML IV SOLN
500.0000 [IU] | Freq: Once | INTRAVENOUS | Status: AC | PRN
  Administered 2018-07-08: 500 [IU]
  Filled 2018-07-08: qty 5

## 2018-07-08 MED ORDER — SODIUM CHLORIDE 0.9 % IV SOLN
Freq: Once | INTRAVENOUS | Status: AC
  Administered 2018-07-08: 10:00:00 via INTRAVENOUS
  Filled 2018-07-08: qty 250

## 2018-07-08 MED ORDER — SODIUM CHLORIDE 0.9 % IV SOLN
480.0000 mg | Freq: Once | INTRAVENOUS | Status: AC
  Administered 2018-07-08: 480 mg via INTRAVENOUS
  Filled 2018-07-08: qty 48

## 2018-07-08 NOTE — Progress Notes (Signed)
Hematology and Oncology Follow Up Visit  Brittany Williamson [id number] 1945/08/20 73 y.o. 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 She is doing well but her husband has had 2 heart attacks in the last week and will be having open heart surgery tomorrow. She is stressed about this and is also caring for his brother who is mentally handicapped. This is all keeping her quite busy.  Her rash has almost completely resolved. No redness or itching.  She is doing well on Synthroid and her TSH is down to 3.652. No fever, chills, n/v, cough, dizziness, SOB, chest pain, palpitations, abdominal pain or changes in bowel or bladder habits.  No swelling, tenderness, numbness or tingling in her extremities at this time.   No lymphadenopathy noted on exam.  She has maintained a good appetite and is staying well hydrated. Her weight is stable.   ECOG Performance Status: 1 - Symptomatic but completely ambulatory  Medications:  Allergies as of 07/08/2018      Reactions   Bee Venom Other (See Comments)   Penicillins Rash   Beeswax Other (See Comments)   Bee stings. Bee stings. Bee stings. Bee stings.      Medication List       Accurate as of July 08, 2018  8:42 AM. Always use your most recent med list.        atorvastatin 20 MG tablet Commonly known as:  LIPITOR Take 20 mg by mouth daily.   butalbital-aspirin-caffeine 50-325-40 MG capsule Commonly known as:  FIORINAL Take 1 capsule by mouth every 6 (six) hours as needed for headache.   carvedilol 3.125 MG tablet Commonly known as:  COREG Take 3.125 mg by mouth 2 (two) times daily.   Diethylpropion HCl CR 75 MG Tb24 Take 75 mg by mouth daily.   doxycycline 100 MG  tablet Commonly known as:  VIBRA-TABS Take 1 tablet (100 mg total) by mouth 2 (two) times daily.   DULoxetine 30 MG capsule Commonly known as:  CYMBALTA Take 30 mg by mouth daily.   levothyroxine 75 MCG tablet Commonly known as:  SYNTHROID Take 1 tablet (75 mcg total) by mouth daily before breakfast.   lidocaine-prilocaine cream Commonly known as:  EMLA Apply 1 application topically as needed. Apply 1 hr prior to pac access and cover with plastic wrap   nystatin cream Commonly known as:  MYCOSTATIN Apply 1 application topically 2 (two) times daily.   valsartan-hydrochlorothiazide 80-12.5 MG tablet Commonly known as:  DIOVAN-HCT       Allergies:  Allergies  Allergen Reactions  . Bee Venom Other (See Comments)  . Penicillins Rash  . Beeswax Other (See Comments)    Bee stings. Bee stings.  Bee stings. Bee stings.    Past Medical History, Surgical history, Social history, and Family History were reviewed and updated.  Review of Systems: All other 10 point review of systems is negative.   Physical Exam:  vitals were not taken for this visit.   Wt Readings from Last 3 Encounters:  06/09/18 252 lb (114.3 kg)  05/12/18 252 lb (114.3 kg)  04/14/18 250 lb (113.4 kg)    Ocular: Sclerae unicteric, pupils equal, round and reactive to light Ear-nose-throat: Oropharynx clear, dentition fair Lymphatic: No cervical,  supraclavicular or axillary adenopathy Lungs no rales or rhonchi, good excursion bilaterally Heart regular rate and rhythm, no murmur appreciated Abd soft, nontender, positive bowel sounds, no liver or spleen tip palpated on exam, no fluid wave  MSK no focal spinal tenderness, no joint edema Neuro: non-focal, well-oriented, appropriate affect Breasts: Deferred   Lab Results  Component Value Date   WBC 9.6 07/04/2018   HGB 13.1 07/04/2018   HCT 40.6 07/04/2018   MCV 85.5 07/04/2018   PLT 312 07/04/2018   No results found for: FERRITIN, IRON, TIBC,  UIBC, IRONPCTSAT Lab Results  Component Value Date   RBC 4.75 07/04/2018   No results found for: KPAFRELGTCHN, LAMBDASER, KAPLAMBRATIO No results found for: IGGSERUM, IGA, IGMSERUM No results found for: Odetta Pink, SPEI   Chemistry      Component Value Date/Time   NA 138 07/04/2018 1343   K 4.0 07/04/2018 1343   CL 107 07/04/2018 1343   CO2 23 07/04/2018 1343   BUN 17 07/04/2018 1343   CREATININE 0.84 07/04/2018 1343      Component Value Date/Time   CALCIUM 9.6 07/04/2018 1343   ALKPHOS 84 07/04/2018 1343   AST 15 07/04/2018 1343   ALT 17 07/04/2018 1343   BILITOT 0.4 07/04/2018 1343       Impression and Plan: Brittany Williamson is a very pleasant 73 yo caucasian female with stage IIIa (T2N1M0) nodular melanoma of the left shoulder. Her left should excision site has healed nicely.  She is tolerating Opdivo nicely and has no complaints at this time.  We will proceed with treatment today as planned.  She has responded nicely to the increase in Synthroid and her TSH is now 3.652.  We will see her in another 4 weeks.  She will contact our office with any questions or concerns. We can certainly see her sooner if need be.   Brittany Peace, NP 1/7/20208:42 AM

## 2018-07-08 NOTE — Patient Instructions (Signed)
Nivolumab injection What is this medicine? NIVOLUMAB (nye VOL ue mab) is a monoclonal antibody. It is used to treat melanoma, lung cancer, kidney cancer, head and neck cancer, Hodgkin lymphoma, urothelial cancer, colon cancer, and liver cancer. This medicine may be used for other purposes; ask your health care provider or pharmacist if you have questions. COMMON BRAND NAME(S): Opdivo What should I tell my health care provider before I take this medicine? They need to know if you have any of these conditions: -diabetes -immune system problems -kidney disease -liver disease -lung disease -organ transplant -stomach or intestine problems -thyroid disease -an unusual or allergic reaction to nivolumab, other medicines, foods, dyes, or preservatives -pregnant or trying to get pregnant -breast-feeding How should I use this medicine? This medicine is for infusion into a vein. It is given by a health care professional in a hospital or clinic setting. A special MedGuide will be given to you before each treatment. Be sure to read this information carefully each time. Talk to your pediatrician regarding the use of this medicine in children. While this drug may be prescribed for children as young as 12 years for selected conditions, precautions do apply. Overdosage: If you think you have taken too much of this medicine contact a poison control center or emergency room at once. NOTE: This medicine is only for you. Do not share this medicine with others. What if I miss a dose? It is important not to miss your dose. Call your doctor or health care professional if you are unable to keep an appointment. What may interact with this medicine? Interactions have not been studied. Give your health care provider a list of all the medicines, herbs, non-prescription drugs, or dietary supplements you use. Also tell them if you smoke, drink alcohol, or use illegal drugs. Some items may interact with your  medicine. This list may not describe all possible interactions. Give your health care provider a list of all the medicines, herbs, non-prescription drugs, or dietary supplements you use. Also tell them if you smoke, drink alcohol, or use illegal drugs. Some items may interact with your medicine. What should I watch for while using this medicine? This drug may make you feel generally unwell. Continue your course of treatment even though you feel ill unless your doctor tells you to stop. You may need blood work done while you are taking this medicine. Do not become pregnant while taking this medicine or for 5 months after stopping it. Women should inform their doctor if they wish to become pregnant or think they might be pregnant. There is a potential for serious side effects to an unborn child. Talk to your health care professional or pharmacist for more information. Do not breast-feed an infant while taking this medicine. What side effects may I notice from receiving this medicine? Side effects that you should report to your doctor or health care professional as soon as possible: -allergic reactions like skin rash, itching or hives, swelling of the face, lips, or tongue -black, tarry stools -blood in the urine -bloody or watery diarrhea -changes in vision -change in sex drive -changes in emotions or moods -chest pain -confusion -cough -decreased appetite -diarrhea -facial flushing -feeling faint or lightheaded -fever, chills -hair loss -hallucination, loss of contact with reality -headache -irritable -joint pain -loss of memory -muscle pain -muscle weakness -seizures -shortness of breath -signs and symptoms of high blood sugar such as dizziness; dry mouth; dry skin; fruity breath; nausea; stomach pain; increased hunger or thirst; increased   urination -signs and symptoms of kidney injury like trouble passing urine or change in the amount of urine -signs and symptoms of liver injury  like dark yellow or brown urine; general ill feeling or flu-like symptoms; light-colored stools; loss of appetite; nausea; right upper belly pain; unusually weak or tired; yellowing of the eyes or skin -stiff neck -swelling of the ankles, feet, hands -weight gain Side effects that usually do not require medical attention (report to your doctor or health care professional if they continue or are bothersome): -bone pain -constipation -tiredness -vomiting This list may not describe all possible side effects. Call your doctor for medical advice about side effects. You may report side effects to FDA at 1-800-FDA-1088. Where should I keep my medicine? This drug is given in a hospital or clinic and will not be stored at home. NOTE: This sheet is a summary. It may not cover all possible information. If you have questions about this medicine, talk to your doctor, pharmacist, or health care provider.  2018 Elsevier/Gold Standard (2016-03-26 17:49:34)  

## 2018-08-01 ENCOUNTER — Inpatient Hospital Stay: Payer: Medicare HMO

## 2018-08-01 DIAGNOSIS — C4362 Malignant melanoma of left upper limb, including shoulder: Secondary | ICD-10-CM

## 2018-08-01 DIAGNOSIS — Z5112 Encounter for antineoplastic immunotherapy: Secondary | ICD-10-CM | POA: Diagnosis not present

## 2018-08-01 LAB — CBC WITH DIFFERENTIAL (CANCER CENTER ONLY)
Abs Immature Granulocytes: 0.02 10*3/uL (ref 0.00–0.07)
Basophils Absolute: 0 10*3/uL (ref 0.0–0.1)
Basophils Relative: 0 %
Eosinophils Absolute: 0.2 10*3/uL (ref 0.0–0.5)
Eosinophils Relative: 3 %
HCT: 40.3 % (ref 36.0–46.0)
Hemoglobin: 13.3 g/dL (ref 12.0–15.0)
IMMATURE GRANULOCYTES: 0 %
Lymphocytes Relative: 38 %
Lymphs Abs: 3.3 10*3/uL (ref 0.7–4.0)
MCH: 28 pg (ref 26.0–34.0)
MCHC: 33 g/dL (ref 30.0–36.0)
MCV: 84.8 fL (ref 80.0–100.0)
Monocytes Absolute: 0.8 10*3/uL (ref 0.1–1.0)
Monocytes Relative: 9 %
NRBC: 0 % (ref 0.0–0.2)
Neutro Abs: 4.5 10*3/uL (ref 1.7–7.7)
Neutrophils Relative %: 50 %
Platelet Count: 314 10*3/uL (ref 150–400)
RBC: 4.75 MIL/uL (ref 3.87–5.11)
RDW: 13.7 % (ref 11.5–15.5)
WBC Count: 8.8 10*3/uL (ref 4.0–10.5)

## 2018-08-01 LAB — CMP (CANCER CENTER ONLY)
ALBUMIN: 4.6 g/dL (ref 3.5–5.0)
ALT: 15 U/L (ref 0–44)
AST: 15 U/L (ref 15–41)
Alkaline Phosphatase: 85 U/L (ref 38–126)
Anion gap: 10 (ref 5–15)
BUN: 17 mg/dL (ref 8–23)
CO2: 25 mmol/L (ref 22–32)
Calcium: 9.7 mg/dL (ref 8.9–10.3)
Chloride: 105 mmol/L (ref 98–111)
Creatinine: 0.82 mg/dL (ref 0.44–1.00)
GFR, Est AFR Am: >60 mL/min (ref >60)
GFR, Estimated: >60 mL/min (ref >60)
GLUCOSE: 97 mg/dL (ref 70–99)
Potassium: 4 mmol/L (ref 3.5–5.1)
Sodium: 140 mmol/L (ref 135–145)
Total Bilirubin: 0.5 mg/dL (ref 0.3–1.2)
Total Protein: 7.1 g/dL (ref 6.5–8.1)

## 2018-08-04 ENCOUNTER — Inpatient Hospital Stay: Payer: Medicare HMO

## 2018-08-04 ENCOUNTER — Inpatient Hospital Stay: Payer: Medicare HMO | Admitting: Hematology & Oncology

## 2018-08-04 ENCOUNTER — Telehealth: Payer: Self-pay | Admitting: *Deleted

## 2018-08-04 NOTE — Telephone Encounter (Signed)
Message received from patient stating that she would like to reschedule her appt today d/t migraine headache.  Call placed back to patient and patient transferred to scheduling to reschedule appt.  Patient appreciative of call back.

## 2018-08-05 ENCOUNTER — Ambulatory Visit: Payer: Medicare HMO

## 2018-08-05 ENCOUNTER — Ambulatory Visit: Payer: Medicare HMO | Admitting: Hematology & Oncology

## 2018-08-11 ENCOUNTER — Other Ambulatory Visit: Payer: Self-pay

## 2018-08-11 ENCOUNTER — Encounter: Payer: Self-pay | Admitting: Family

## 2018-08-11 ENCOUNTER — Inpatient Hospital Stay: Payer: Medicare HMO | Attending: Hematology & Oncology | Admitting: Family

## 2018-08-11 ENCOUNTER — Telehealth: Payer: Self-pay | Admitting: Hematology & Oncology

## 2018-08-11 ENCOUNTER — Inpatient Hospital Stay: Payer: Medicare HMO

## 2018-08-11 VITALS — BP 153/83 | HR 89 | Temp 98.0°F | Resp 18 | Ht 65.0 in | Wt 242.4 lb

## 2018-08-11 DIAGNOSIS — E032 Hypothyroidism due to medicaments and other exogenous substances: Secondary | ICD-10-CM

## 2018-08-11 DIAGNOSIS — C4362 Malignant melanoma of left upper limb, including shoulder: Secondary | ICD-10-CM | POA: Diagnosis not present

## 2018-08-11 DIAGNOSIS — R21 Rash and other nonspecific skin eruption: Secondary | ICD-10-CM | POA: Diagnosis not present

## 2018-08-11 MED ORDER — METHYLPREDNISOLONE 4 MG PO TBPK: ORAL_TABLET | ORAL | 0 refills | Status: AC

## 2018-08-11 NOTE — Progress Notes (Addendum)
Hematology and Oncology Follow Up Visit  Brittany Williamson [id number] [birth year] 73 y.o. 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 She has a diffuse maculopapular rash on her arms, legs, chest and back for the last few weeks. It does itch.  She denies fatigue and states that other than the rash she is doing quite well.  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  She rolled out of bed last week and bruised her knee. Thankfully she was not seriously injured.  No fever, chills, n/v, cough, dizziness, SOB, chest pain, palpitations, abdominal pain or changes in bowel or bladder habits.  The swelling in her ankles is stable and unchanged. Pedal pulses are 2+.  No numbness or tingling in her extremities at this time.  No lymphadenopathy noted on exam.  She has maintained a good appetite and is staying well hydrated. Her weight is stable.   ECOG Performance Status: 1 - Symptomatic but completely ambulatory  Medications:  Allergies as of 08/11/2018      Reactions   Bee Venom Other (See Comments)   Penicillins Rash   Beeswax Other (See Comments)   Bee stings. Bee stings. Bee stings. Bee stings.      Medication List       Accurate as of August 11, 2018 11:39 AM. Always use your most recent med list.        atorvastatin 20 MG tablet Commonly known as:  LIPITOR Take 20 mg by mouth daily.   butalbital-aspirin-caffeine 50-325-40 MG capsule Commonly known as:  FIORINAL Take 1 capsule by mouth every 6 (six) hours as needed for headache.   carvedilol 3.125 MG tablet Commonly known as:  COREG Take 3.125 mg by mouth 2 (two) times daily.   Diethylpropion HCl CR 75 MG Tb24 Take  75 mg by mouth daily.   doxycycline 100 MG tablet Commonly known as:  VIBRA-TABS Take 1 tablet (100 mg total) by mouth 2 (two) times daily.   DULoxetine 30 MG capsule Commonly known as:  CYMBALTA Take 30 mg by mouth daily.   levothyroxine 75 MCG tablet Commonly known as:  SYNTHROID Take 1 tablet (75 mcg total) by mouth daily before breakfast.   lidocaine-prilocaine cream Commonly known as:  EMLA Apply 1 application topically as needed. Apply 1 hr prior to pac access and cover with plastic wrap   nystatin cream Commonly known as:  MYCOSTATIN Apply 1 application topically 2 (two) times daily.   valsartan-hydrochlorothiazide 80-12.5 MG tablet Commonly known as:  DIOVAN-HCT       Allergies:  Allergies  Allergen Reactions  . Bee Venom Other (See Comments)  . Penicillins Rash  . Beeswax Other (See Comments)    Bee stings. Bee stings.  Bee stings. Bee stings.    Past Medical History, Surgical history, Social history, and Family History were reviewed and updated.  Review of Systems: All other 10 point review of systems is negative.   Physical Exam:  vitals were not taken for this visit.   Wt Readings from Last 3 Encounters:  07/08/18 247 lb (112 kg)  06/09/18 252 lb (114.3 kg)  05/12/18 252 lb (114.3 kg)    Ocular: Sclerae unicteric, pupils equal, round and reactive to light  Ear-nose-throat: Oropharynx clear, dentition fair Lymphatic: No cervical, supraclavicular or axillary adenopathy Lungs no rales or rhonchi, good excursion bilaterally Heart regular rate and rhythm, no murmur appreciated Abd soft, nontender, positive bowel sounds, no liver or spleen tip palpated on exam, no fluid wave  MSK no focal spinal tenderness, no joint edema Neuro: non-focal, well-oriented, appropriate affect Breasts: Deferred   Lab Results  Component Value Date   WBC 8.8 08/01/2018   HGB 13.3 08/01/2018   HCT 40.3 08/01/2018   MCV 84.8 08/01/2018   PLT 314 08/01/2018   No  results found for: FERRITIN, IRON, TIBC, UIBC, IRONPCTSAT Lab Results  Component Value Date   RBC 4.75 08/01/2018   No results found for: KPAFRELGTCHN, LAMBDASER, KAPLAMBRATIO No results found for: IGGSERUM, IGA, IGMSERUM No results found for: Kathrynn Ducking, MSPIKE, SPEI   Chemistry      Component Value Date/Time   NA 140 08/01/2018 1400   K 4.0 08/01/2018 1400   CL 105 08/01/2018 1400   CO2 25 08/01/2018 1400   BUN 17 08/01/2018 1400   CREATININE 0.82 08/01/2018 1400      Component Value Date/Time   CALCIUM 9.7 08/01/2018 1400   ALKPHOS 85 08/01/2018 1400   AST 15 08/01/2018 1400   ALT 15 08/01/2018 1400   BILITOT 0.5 08/01/2018 1400       Impression and Plan: Brittany Williamson is a very pleasant 73 yo caucasian female with stage IIIa (T2N1M0) nodular melanoma of the left shoulder. Her left should excision site has healed nicely. She has done well so far with Opdivo.  I spoke with Dr. Marin Olp about her skin rash and we will hold treatment this week and she will complete a medrol dose pack.  She is in agreement with the plan and will contact our office with any questions or concerns.  We will see her back in another 3 weeks. We can certainly see her sooner if need be.   Laverna Peace, NP 2/10/202011:39 AM

## 2018-08-11 NOTE — Telephone Encounter (Signed)
Called and spoke with patient regarding appointments added to her schedule.  She will keep 2/28 lab appt instead of having to come in on Monday @ 8:45 prior to treatment per 2/10 los

## 2018-08-29 ENCOUNTER — Other Ambulatory Visit: Payer: Medicare HMO

## 2018-09-01 ENCOUNTER — Inpatient Hospital Stay: Payer: Medicare HMO

## 2018-09-01 ENCOUNTER — Other Ambulatory Visit: Payer: Medicare HMO

## 2018-09-01 ENCOUNTER — Encounter: Payer: Self-pay | Admitting: Hematology & Oncology

## 2018-09-01 ENCOUNTER — Other Ambulatory Visit: Payer: Self-pay

## 2018-09-01 ENCOUNTER — Inpatient Hospital Stay: Payer: Medicare HMO | Attending: Hematology & Oncology | Admitting: Hematology & Oncology

## 2018-09-01 VITALS — BP 122/106 | HR 71 | Temp 98.0°F | Resp 18 | Wt 243.0 lb

## 2018-09-01 DIAGNOSIS — C4362 Malignant melanoma of left upper limb, including shoulder: Secondary | ICD-10-CM | POA: Diagnosis not present

## 2018-09-01 DIAGNOSIS — R21 Rash and other nonspecific skin eruption: Secondary | ICD-10-CM

## 2018-09-01 DIAGNOSIS — D509 Iron deficiency anemia, unspecified: Secondary | ICD-10-CM | POA: Diagnosis not present

## 2018-09-01 DIAGNOSIS — Z5112 Encounter for antineoplastic immunotherapy: Secondary | ICD-10-CM | POA: Diagnosis not present

## 2018-09-01 DIAGNOSIS — E032 Hypothyroidism due to medicaments and other exogenous substances: Secondary | ICD-10-CM

## 2018-09-01 DIAGNOSIS — Z79899 Other long term (current) drug therapy: Secondary | ICD-10-CM | POA: Insufficient documentation

## 2018-09-01 LAB — CMP (CANCER CENTER ONLY)
ALK PHOS: 87 U/L (ref 38–126)
ALT: 17 U/L (ref 0–44)
AST: 15 U/L (ref 15–41)
Albumin: 4.3 g/dL (ref 3.5–5.0)
Anion gap: 10 (ref 5–15)
BUN: 18 mg/dL (ref 8–23)
CO2: 26 mmol/L (ref 22–32)
Calcium: 9 mg/dL (ref 8.9–10.3)
Chloride: 104 mmol/L (ref 98–111)
Creatinine: 0.85 mg/dL (ref 0.44–1.00)
GFR, Est AFR Am: >60 mL/min (ref >60)
GFR, Estimated: >60 mL/min (ref >60)
Glucose, Bld: 190 mg/dL — ABNORMAL HIGH (ref 70–99)
Potassium: 3.5 mmol/L (ref 3.5–5.1)
Sodium: 140 mmol/L (ref 135–145)
Total Bilirubin: 0.5 mg/dL (ref 0.3–1.2)
Total Protein: 7.1 g/dL (ref 6.5–8.1)

## 2018-09-01 LAB — CBC WITH DIFFERENTIAL (CANCER CENTER ONLY)
Abs Immature Granulocytes: 0.03 10*3/uL (ref 0.00–0.07)
Basophils Absolute: 0 10*3/uL (ref 0.0–0.1)
Basophils Relative: 0 %
Eosinophils Absolute: 0.3 10*3/uL (ref 0.0–0.5)
Eosinophils Relative: 3 %
HCT: 40.1 % (ref 36.0–46.0)
Hemoglobin: 12.9 g/dL (ref 12.0–15.0)
IMMATURE GRANULOCYTES: 0 %
Lymphocytes Relative: 33 %
Lymphs Abs: 2.7 10*3/uL (ref 0.7–4.0)
MCH: 27.7 pg (ref 26.0–34.0)
MCHC: 32.2 g/dL (ref 30.0–36.0)
MCV: 86.1 fL (ref 80.0–100.0)
Monocytes Absolute: 0.5 10*3/uL (ref 0.1–1.0)
Monocytes Relative: 6 %
Neutro Abs: 4.6 10*3/uL (ref 1.7–7.7)
Neutrophils Relative %: 58 %
Platelet Count: 248 10*3/uL (ref 150–400)
RBC: 4.66 MIL/uL (ref 3.87–5.11)
RDW: 14.2 % (ref 11.5–15.5)
WBC Count: 8.1 10*3/uL (ref 4.0–10.5)
nRBC: 0 % (ref 0.0–0.2)

## 2018-09-01 LAB — TSH: TSH: 2.587 u[IU]/mL (ref 0.308–3.960)

## 2018-09-01 MED ORDER — ACETAMINOPHEN 325 MG PO TABS
650.0000 mg | ORAL_TABLET | Freq: Once | ORAL | Status: AC
  Administered 2018-09-01: 650 mg via ORAL

## 2018-09-01 MED ORDER — SODIUM CHLORIDE 0.9% FLUSH
10.0000 mL | INTRAVENOUS | Status: DC | PRN
  Administered 2018-09-01: 10 mL
  Filled 2018-09-01: qty 10

## 2018-09-01 MED ORDER — SODIUM CHLORIDE 0.9 % IV SOLN
Freq: Once | INTRAVENOUS | Status: AC
  Administered 2018-09-01: 11:00:00 via INTRAVENOUS
  Filled 2018-09-01: qty 250

## 2018-09-01 MED ORDER — HEPARIN SOD (PORK) LOCK FLUSH 100 UNIT/ML IV SOLN
500.0000 [IU] | Freq: Once | INTRAVENOUS | Status: AC | PRN
  Administered 2018-09-01: 500 [IU]
  Filled 2018-09-01: qty 5

## 2018-09-01 MED ORDER — ACETAMINOPHEN 325 MG PO TABS
ORAL_TABLET | ORAL | Status: AC
  Filled 2018-09-01: qty 2

## 2018-09-01 MED ORDER — SODIUM CHLORIDE 0.9 % IV SOLN
480.0000 mg | Freq: Once | INTRAVENOUS | Status: AC
  Administered 2018-09-01: 480 mg via INTRAVENOUS
  Filled 2018-09-01: qty 48

## 2018-09-01 NOTE — Progress Notes (Signed)
Hematology and Oncology Follow Up Visit  Brittany Williamson [id number] [birth year] 73 y.o. 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  Unfortunately, her husband passed away last week.  He apparently had a heart attack over Christmas.  He had triple bypass surgery at [facility].  He apparently has some strokes during surgery.  In some ways, it is a blessing that he has passed.  He really had no quality of life.  She is still struggling with this.  She has a big house.  Apparently, her brother-in-law, who lives with him, is mentally challenged.  He has the mind of a 17-year-old.  I am not sure that Brittany Williamson is going to be able to manage him.  As far as the Opdivo goes, she is doing okay.  She had this unusual rash on her arms and legs.  I am not sure exactly what that was.  This has improved.  We to see if this recurs after the Opdivo.  She has had no change in bowel or bladder habits.  She has had no nausea or vomiting.  There is been no bleeding.  She has had no cough.  Overall, her performance status is ECOG 1.  Medications:  Allergies as of 09/01/2018      Reactions   Bee Venom Other (See Comments)   Penicillins Rash   Beeswax Other (See Comments)   Bee stings. Bee stings. Bee stings. Bee stings.      Medication List       Accurate as of September 01, 2018 10:07 AM. Always use your most recent med list.        atorvastatin 20 MG tablet Commonly known as:  LIPITOR Take 20 mg by mouth daily.   butalbital-aspirin-caffeine 50-325-40 MG capsule Commonly known as:  FIORINAL Take 1 capsule by mouth every 6 (six) hours as needed for headache.   carvedilol 3.125 MG tablet Commonly known as:   COREG Take 3.125 mg by mouth 2 (two) times daily.   Diethylpropion HCl CR 75 MG Tb24 Take 75 mg by mouth daily.   doxycycline 100 MG tablet Commonly known as:  VIBRA-TABS Take 1 tablet (100 mg total) by mouth 2 (two) times daily.   DULoxetine 30 MG capsule Commonly known as:  CYMBALTA Take 30 mg by mouth daily.   levothyroxine 75 MCG tablet Commonly known as:  SYNTHROID Take 1 tablet (75 mcg total) by mouth daily before breakfast.   lidocaine-prilocaine cream Commonly known as:  EMLA Apply 1 application topically as needed. Apply 1 hr prior to pac access and cover with plastic wrap   methylPREDNISolone 4 MG Tbpk tablet Commonly known as:  MEDROL DOSEPAK Take as directed on package.   nystatin cream Commonly known as:  MYCOSTATIN Apply 1 application topically 2 (two) times daily.   valsartan-hydrochlorothiazide 80-12.5 MG tablet Commonly known as:  DIOVAN-HCT       Allergies:  Allergies  Allergen Reactions  . Bee Venom Other (See Comments)  . Penicillins Rash  . Beeswax Other (See Comments)    Bee stings. Bee stings.  Bee stings. Bee stings.    Past Medical History, Surgical history, Social history, and Family History were reviewed and updated.  Review of Systems: Review of  Systems  Constitutional: Negative.   HENT: Negative.   Eyes: Negative.   Respiratory: Negative.   Cardiovascular: Negative.   Gastrointestinal: Negative.   Genitourinary: Negative.   Musculoskeletal: Negative.   Skin: Positive for rash.  Neurological: Negative.   Endo/Heme/Allergies: Negative.   Psychiatric/Behavioral: Negative.     Physical Exam:  weight is 243 lb (110.2 kg). Her oral temperature is 98 F (36.7 C). Her blood pressure is 122/106 (abnormal) and her pulse is 71. Her respiration is 18 and oxygen saturation is 97%.   Wt Readings from Last 3 Encounters:  09/01/18 243 lb (110.2 kg)  08/11/18 242 lb 6.4 oz (110 kg)  07/08/18 247 lb (112 kg)    Physical  Exam Vitals signs reviewed.  HENT:     Head: Normocephalic and atraumatic.  Eyes:     Pupils: Pupils are equal, round, and reactive to light.  Neck:     Musculoskeletal: Normal range of motion.  Cardiovascular:     Rate and Rhythm: Normal rate and regular rhythm.     Heart sounds: Normal heart sounds.  Pulmonary:     Effort: Pulmonary effort is normal.     Breath sounds: Normal breath sounds.  Abdominal:     General: Bowel sounds are normal.     Palpations: Abdomen is soft.  Musculoskeletal: Normal range of motion.        General: No tenderness or deformity.  Lymphadenopathy:     Cervical: No cervical adenopathy.  Skin:    General: Skin is warm and dry.     Findings: No erythema or rash.     Comments: There is a faint maculopapular rash on her arms and legs.  She does have the bilateral leg swelling which is chronic.  There is no excoriations.  There is no arm swelling.  Neurological:     Mental Status: She is alert and oriented to person, place, and time.  Psychiatric:        Behavior: Behavior normal.        Thought Content: Thought content normal.        Judgment: Judgment normal.      Lab Results  Component Value Date   WBC 8.1 09/01/2018   HGB 12.9 09/01/2018   HCT 40.1 09/01/2018   MCV 86.1 09/01/2018   PLT 248 09/01/2018   No results found for: FERRITIN, IRON, TIBC, UIBC, IRONPCTSAT Lab Results  Component Value Date   RBC 4.66 09/01/2018   No results found for: KPAFRELGTCHN, LAMBDASER, KAPLAMBRATIO No results found for: IGGSERUM, IGA, IGMSERUM No results found for: Kathrynn Ducking, MSPIKE, SPEI   Chemistry      Component Value Date/Time   NA 140 09/01/2018 0900   K 3.5 09/01/2018 0900   CL 104 09/01/2018 0900   CO2 26 09/01/2018 0900   BUN 18 09/01/2018 0900   CREATININE 0.85 09/01/2018 0900      Component Value Date/Time   CALCIUM 9.0 09/01/2018 0900   ALKPHOS 87 09/01/2018 0900   AST 15 09/01/2018 0900    ALT 17 09/01/2018 0900   BILITOT 0.5 09/01/2018 0900       Impression and Plan: Brittany Williamson is a very pleasant 73 yo caucasian female with stage IIIa (T2N1M0) nodular melanoma of the left shoulder.   Hopefully, she will not have a recurrence of this rash with the Opdivo.  This will be her ninth cycle today.  We will have her come back in 1 month for her  10th cycle.     Volanda Napoleon, MD 3/2/202010:07 AM

## 2018-09-01 NOTE — Patient Instructions (Signed)
Implanted Port Insertion, Care After  This sheet gives you information about how to care for yourself after your procedure. Your health care provider may also give you more specific instructions. If you have problems or questions, contact your health care provider.  What can I expect after the procedure?  After the procedure, it is common to have:  · Discomfort at the port insertion site.  · Bruising on the skin over the port. This should improve over 3-4 days.  Follow these instructions at home:  Port care  · After your port is placed, you will get a manufacturer's information card. The card has information about your port. Keep this card with you at all times.  · Take care of the port as told by your health care provider. Ask your health care provider if you or a family member can get training for taking care of the port at home. A home health care nurse may also take care of the port.  · Make sure to remember what type of port you have.  Incision care         · Follow instructions from your health care provider about how to take care of your port insertion site. Make sure you:  ? Wash your hands with soap and water before and after you change your bandage (dressing). If soap and water are not available, use hand sanitizer.  ? Change your dressing as told by your health care provider.  ? Leave stitches (sutures), skin glue, or adhesive strips in place. These skin closures may need to stay in place for 2 weeks or longer. If adhesive strip edges start to loosen and curl up, you may trim the loose edges. Do not remove adhesive strips completely unless your health care provider tells you to do that.  · Check your port insertion site every day for signs of infection. Check for:  ? Redness, swelling, or pain.  ? Fluid or blood.  ? Warmth.  ? Pus or a bad smell.  Activity  · Return to your normal activities as told by your health care provider. Ask your health care provider what activities are safe for you.  · Do not  lift anything that is heavier than 10 lb (4.5 kg), or the limit that you are told, until your health care provider says that it is safe.  General instructions  · Take over-the-counter and prescription medicines only as told by your health care provider.  · Do not take baths, swim, or use a hot tub until your health care provider approves. Ask your health care provider if you may take showers. You may only be allowed to take sponge baths.  · Do not drive for 24 hours if you were given a sedative during your procedure.  · Wear a medical alert bracelet in case of an emergency. This will tell any health care providers that you have a port.  · Keep all follow-up visits as told by your health care provider. This is important.  Contact a health care provider if:  · You cannot flush your port with saline as directed, or you cannot draw blood from the port.  · You have a fever or chills.  · You have redness, swelling, or pain around your port insertion site.  · You have fluid or blood coming from your port insertion site.  · Your port insertion site feels warm to the touch.  · You have pus or a bad smell coming from the port   insertion site.  Get help right away if:  · You have chest pain or shortness of breath.  · You have bleeding from your port that you cannot control.  Summary  · Take care of the port as told by your health care provider. Keep the manufacturer's information card with you at all times.  · Change your dressing as told by your health care provider.  · Contact a health care provider if you have a fever or chills or if you have redness, swelling, or pain around your port insertion site.  · Keep all follow-up visits as told by your health care provider.  This information is not intended to replace advice given to you by your health care provider. Make sure you discuss any questions you have with your health care provider.  Document Released: 04/08/2013 Document Revised: 01/14/2018 Document Reviewed:  01/14/2018  Elsevier Interactive Patient Education © 2019 Elsevier Inc[employer]

## 2018-09-01 NOTE — Patient Instructions (Signed)
[facility] Discharge Instructions for Patients Receiving Chemotherapy  Today you received the following chemotherapy agents:  Nivolumab  To help prevent nausea and vomiting after your treatment, we encourage you to take your nausea medication as prescribed.   If you develop nausea and vomiting that is not controlled by your nausea medication, call the clinic.   BELOW ARE SYMPTOMS THAT SHOULD BE REPORTED IMMEDIATELY:  *FEVER GREATER THAN 100.5 F  *CHILLS WITH OR WITHOUT FEVER  NAUSEA AND VOMITING THAT IS NOT CONTROLLED WITH YOUR NAUSEA MEDICATION  *UNUSUAL SHORTNESS OF BREATH  *UNUSUAL BRUISING OR BLEEDING  TENDERNESS IN MOUTH AND THROAT WITH OR WITHOUT PRESENCE OF ULCERS  *URINARY PROBLEMS  *BOWEL PROBLEMS  UNUSUAL RASH Items with * indicate a potential emergency and should be followed up as soon as possible.  Feel free to call the clinic should you have any questions or concerns. The clinic phone number is [phone].  Please show the CHEMO ALERT CARD at check-in to the Emergency Department and triage nurse.   

## 2018-09-02 ENCOUNTER — Ambulatory Visit: Payer: Medicare HMO

## 2018-09-02 ENCOUNTER — Ambulatory Visit: Payer: Medicare HMO | Admitting: Family

## 2018-09-24 ENCOUNTER — Other Ambulatory Visit: Payer: Self-pay | Admitting: Family

## 2018-09-24 DIAGNOSIS — C4362 Malignant melanoma of left upper limb, including shoulder: Secondary | ICD-10-CM

## 2018-09-29 ENCOUNTER — Other Ambulatory Visit: Payer: Self-pay

## 2018-09-29 ENCOUNTER — Inpatient Hospital Stay: Payer: Medicare HMO

## 2018-09-29 ENCOUNTER — Inpatient Hospital Stay (HOSPITAL_BASED_OUTPATIENT_CLINIC_OR_DEPARTMENT_OTHER): Payer: Medicare HMO | Admitting: Hematology & Oncology

## 2018-09-29 ENCOUNTER — Telehealth: Payer: Self-pay | Admitting: Hematology & Oncology

## 2018-09-29 ENCOUNTER — Encounter: Payer: Self-pay | Admitting: Hematology & Oncology

## 2018-09-29 ENCOUNTER — Telehealth: Payer: Self-pay | Admitting: *Deleted

## 2018-09-29 VITALS — BP 145/91 | HR 85 | Temp 97.9°F | Resp 19 | Wt 239.0 lb

## 2018-09-29 DIAGNOSIS — L570 Actinic keratosis: Secondary | ICD-10-CM | POA: Diagnosis not present

## 2018-09-29 DIAGNOSIS — M7989 Other specified soft tissue disorders: Secondary | ICD-10-CM | POA: Diagnosis not present

## 2018-09-29 DIAGNOSIS — C4362 Malignant melanoma of left upper limb, including shoulder: Secondary | ICD-10-CM

## 2018-09-29 DIAGNOSIS — Z5112 Encounter for antineoplastic immunotherapy: Secondary | ICD-10-CM | POA: Diagnosis not present

## 2018-09-29 LAB — CBC WITH DIFFERENTIAL (CANCER CENTER ONLY)
Abs Immature Granulocytes: 0.03 10*3/uL (ref 0.00–0.07)
Basophils Absolute: 0 10*3/uL (ref 0.0–0.1)
Basophils Relative: 1 %
Eosinophils Absolute: 0.2 10*3/uL (ref 0.0–0.5)
Eosinophils Relative: 3 %
HCT: 40.6 % (ref 36.0–46.0)
Hemoglobin: 13.3 g/dL (ref 12.0–15.0)
Immature Granulocytes: 0 %
LYMPHS ABS: 2.9 10*3/uL (ref 0.7–4.0)
Lymphocytes Relative: 35 %
MCH: 27.7 pg (ref 26.0–34.0)
MCHC: 32.8 g/dL (ref 30.0–36.0)
MCV: 84.4 fL (ref 80.0–100.0)
Monocytes Absolute: 0.6 10*3/uL (ref 0.1–1.0)
Monocytes Relative: 7 %
NEUTROS PCT: 54 %
Neutro Abs: 4.4 10*3/uL (ref 1.7–7.7)
PLATELETS: 299 10*3/uL (ref 150–400)
RBC: 4.81 MIL/uL (ref 3.87–5.11)
RDW: 14.5 % (ref 11.5–15.5)
WBC Count: 8.1 10*3/uL (ref 4.0–10.5)
nRBC: 0 % (ref 0.0–0.2)

## 2018-09-29 LAB — CMP (CANCER CENTER ONLY)
ALT: 15 U/L (ref 0–44)
AST: 15 U/L (ref 15–41)
Albumin: 4.5 g/dL (ref 3.5–5.0)
Alkaline Phosphatase: 97 U/L (ref 38–126)
Anion gap: 11 (ref 5–15)
BUN: 12 mg/dL (ref 8–23)
CHLORIDE: 103 mmol/L (ref 98–111)
CO2: 26 mmol/L (ref 22–32)
Calcium: 9.4 mg/dL (ref 8.9–10.3)
Creatinine: 0.83 mg/dL (ref 0.44–1.00)
GFR, Est AFR Am: >60 mL/min (ref >60)
Glucose, Bld: 124 mg/dL — ABNORMAL HIGH (ref 70–99)
Potassium: 4.1 mmol/L (ref 3.5–5.1)
Sodium: 140 mmol/L (ref 135–145)
Total Bilirubin: 0.6 mg/dL (ref 0.3–1.2)
Total Protein: 7.6 g/dL (ref 6.5–8.1)

## 2018-09-29 LAB — IRON AND TIBC
Iron: 62 ug/dL (ref 41–142)
Saturation Ratios: 17 % — ABNORMAL LOW (ref 21–57)
TIBC: 360 ug/dL (ref 236–444)
UIBC: 298 ug/dL (ref 120–384)

## 2018-09-29 LAB — TSH: TSH: 2.421 u[IU]/mL (ref 0.308–3.960)

## 2018-09-29 LAB — LACTATE DEHYDROGENASE: LDH: 205 U/L — ABNORMAL HIGH (ref 98–192)

## 2018-09-29 LAB — FERRITIN: Ferritin: 38 ng/mL (ref 11–307)

## 2018-09-29 MED ORDER — SODIUM CHLORIDE 0.9 % IV SOLN
Freq: Once | INTRAVENOUS | Status: AC
  Administered 2018-09-29: 11:00:00 via INTRAVENOUS
  Filled 2018-09-29: qty 250

## 2018-09-29 MED ORDER — SODIUM CHLORIDE 0.9 % IV SOLN
480.0000 mg | Freq: Once | INTRAVENOUS | Status: AC
  Administered 2018-09-29: 480 mg via INTRAVENOUS
  Filled 2018-09-29: qty 48

## 2018-09-29 MED ORDER — HEPARIN SOD (PORK) LOCK FLUSH 100 UNIT/ML IV SOLN
500.0000 [IU] | Freq: Once | INTRAVENOUS | Status: AC | PRN
  Administered 2018-09-29: 500 [IU]
  Filled 2018-09-29: qty 5

## 2018-09-29 MED ORDER — SODIUM CHLORIDE 0.9% FLUSH
10.0000 mL | INTRAVENOUS | Status: DC | PRN
  Administered 2018-09-29: 10 mL
  Filled 2018-09-29: qty 10

## 2018-09-29 NOTE — Patient Instructions (Signed)
Nivolumab injection What is this medicine? NIVOLUMAB (nye VOL ue mab) is a monoclonal antibody. It is used to treat melanoma, lung cancer, kidney cancer, head and neck cancer, Hodgkin lymphoma, urothelial cancer, colon cancer, and liver cancer. This medicine may be used for other purposes; ask your health care provider or pharmacist if you have questions. COMMON BRAND NAME(S): Opdivo What should I tell my health care provider before I take this medicine? They need to know if you have any of these conditions: -diabetes -immune system problems -kidney disease -liver disease -lung disease -organ transplant -stomach or intestine problems -thyroid disease -an unusual or allergic reaction to nivolumab, other medicines, foods, dyes, or preservatives -pregnant or trying to get pregnant -breast-feeding How should I use this medicine? This medicine is for infusion into a vein. It is given by a health care professional in a hospital or clinic setting. A special MedGuide will be given to you before each treatment. Be sure to read this information carefully each time. Talk to your pediatrician regarding the use of this medicine in children. While this drug may be prescribed for children as young as 12 years for selected conditions, precautions do apply. Overdosage: If you think you have taken too much of this medicine contact a poison control center or emergency room at once. NOTE: This medicine is only for you. Do not share this medicine with others. What if I miss a dose? It is important not to miss your dose. Call your doctor or health care professional if you are unable to keep an appointment. What may interact with this medicine? Interactions have not been studied. Give your health care provider a list of all the medicines, herbs, non-prescription drugs, or dietary supplements you use. Also tell them if you smoke, drink alcohol, or use illegal drugs. Some items may interact with your  medicine. This list may not describe all possible interactions. Give your health care provider a list of all the medicines, herbs, non-prescription drugs, or dietary supplements you use. Also tell them if you smoke, drink alcohol, or use illegal drugs. Some items may interact with your medicine. What should I watch for while using this medicine? This drug may make you feel generally unwell. Continue your course of treatment even though you feel ill unless your doctor tells you to stop. You may need blood work done while you are taking this medicine. Do not become pregnant while taking this medicine or for 5 months after stopping it. Women should inform their doctor if they wish to become pregnant or think they might be pregnant. There is a potential for serious side effects to an unborn child. Talk to your health care professional or pharmacist for more information. Do not breast-feed an infant while taking this medicine or for 5 months after stopping it. What side effects may I notice from receiving this medicine? Side effects that you should report to your doctor or health care professional as soon as possible: -allergic reactions like skin rash, itching or hives, swelling of the face, lips, or tongue -breathing problems -blood in the urine -bloody or watery diarrhea or black, tarry stools -changes in emotions or moods -changes in vision -chest pain -cough -dizziness -feeling faint or lightheaded, falls -fever, chills -headache with fever, neck stiffness, confusion, loss of memory, sensitivity to light, hallucination, loss of contact with reality, or seizures -joint pain -mouth sores -redness, blistering, peeling or loosening of the skin, including inside the mouth -severe muscle pain or weakness -signs and symptoms of   high blood sugar such as dizziness; dry mouth; dry skin; fruity breath; nausea; stomach pain; increased hunger or thirst; increased urination -signs and symptoms of kidney  injury like trouble passing urine or change in the amount of urine -signs and symptoms of liver injury like dark yellow or brown urine; general ill feeling or flu-like symptoms; light-colored stools; loss of appetite; nausea; right upper belly pain; unusually weak or tired; yellowing of the eyes or skin -swelling of the ankles, feet, hands -trouble passing urine or change in the amount of urine -unusually weak or tired -weight gain or loss Side effects that usually do not require medical attention (report to your doctor or health care professional if they continue or are bothersome): -bone pain -constipation -decreased appetite -diarrhea -muscle pain -nausea, vomiting -tiredness This list may not describe all possible side effects. Call your doctor for medical advice about side effects. You may report side effects to FDA at 1-800-FDA-1088. Where should I keep my medicine? This drug is given in a hospital or clinic and will not be stored at home. NOTE: This sheet is a summary. It may not cover all possible information. If you have questions about this medicine, talk to your doctor, pharmacist, or health care provider.  2019 Elsevier/Gold Standard (2017-11-06 12:55:04)  

## 2018-09-29 NOTE — Progress Notes (Signed)
Hematology and Oncology Follow Up Visit  Brittany Williamson [id number] [birth year] 73 y.o. 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  So far, she is managing okay.  Her husband passed away about a month or so ago.  She has been trying to clean out her house.  She was very kind and gave me an autograft picture that was signed by Brittany Williamson.  I will definitely put this up in my sports room.  She is doing okay.  She is developing these keratoses on her arms and legs.  She will see her dermatologist.  She does have a suspicious dark nevi in the lumbosacral region.  This is to the left of the midline.  It measures may be 3 mm in size.  It is flat.  She has had no issues with cough or shortness of breath.  We follow her thyroid.  Her TSH back in early March was 2.6.  She is on Synthroid and feels a little bit tired.  Overall, her performance status is ECOG 1.  Medications:  Allergies as of 09/29/2018      Reactions   Bee Venom Other (See Comments)   Penicillins Rash   Beeswax Other (See Comments)   Bee stings. Bee stings. Bee stings. Bee stings.      Medication List       Accurate as of September 29, 2018 10:48 AM. Always use your most recent med list.        atorvastatin 20 MG tablet Commonly known as:  LIPITOR Take 20 mg by mouth daily.   butalbital-aspirin-caffeine 50-325-40 MG capsule Commonly known as:  Fiorinal Take 1 capsule by mouth every 6 (six) hours as needed for headache.   carvedilol 3.125 MG tablet Commonly known as:  COREG Take 3.125 mg by mouth 2 (two) times daily.   Diethylpropion HCl CR 75 MG Tb24 Take 75 mg by mouth daily.   doxycycline 100 MG tablet Commonly known  as:  VIBRA-TABS Take 1 tablet (100 mg total) by mouth 2 (two) times daily.   DULoxetine 30 MG capsule Commonly known as:  CYMBALTA Take 30 mg by mouth daily.   levothyroxine 75 MCG tablet Commonly known as:  Synthroid Take 1 tablet (75 mcg total) by mouth daily before breakfast.   lidocaine-prilocaine cream Commonly known as:  EMLA Apply 1 application topically as needed. Apply 1 hr prior to pac access and cover with plastic wrap   methylPREDNISolone 4 MG Tbpk tablet Commonly known as:  MEDROL DOSEPAK Take as directed on package.   nystatin cream Commonly known as:  MYCOSTATIN Apply 1 application topically 2 (two) times daily.   valsartan-hydrochlorothiazide 80-12.5 MG tablet Commonly known as:  DIOVAN-HCT       Allergies:  Allergies  Allergen Reactions  . Bee Venom Other (See Comments)  . Penicillins Rash  . Beeswax Other (See Comments)    Bee stings. Bee stings.  Bee stings. Bee stings.    Past Medical History, Surgical history, Social history, and Family History were reviewed and updated.  Review of Systems: Review of Systems  Constitutional: Negative.   HENT: Negative.   Eyes: Negative.   Respiratory: Negative.   Cardiovascular: Negative.   Gastrointestinal: Negative.   Genitourinary: Negative.  Musculoskeletal: Negative.   Skin: Positive for rash.  Neurological: Negative.   Endo/Heme/Allergies: Negative.   Psychiatric/Behavioral: Negative.     Physical Exam:  weight is 239 lb (108.4 kg). Her oral temperature is 97.9 F (36.6 C). Her blood pressure is 145/91 (abnormal) and her pulse is 85. Her respiration is 19 and oxygen saturation is 97%.   Wt Readings from Last 3 Encounters:  09/29/18 239 lb (108.4 kg)  09/01/18 243 lb (110.2 kg)  08/11/18 242 lb 6.4 oz (110 kg)    Physical Exam Vitals signs reviewed.  HENT:     Head: Normocephalic and atraumatic.  Eyes:     Pupils: Pupils are equal, round, and reactive to light.  Neck:      Musculoskeletal: Normal range of motion.  Cardiovascular:     Rate and Rhythm: Normal rate and regular rhythm.     Heart sounds: Normal heart sounds.  Pulmonary:     Effort: Pulmonary effort is normal.     Breath sounds: Normal breath sounds.  Abdominal:     General: Bowel sounds are normal.     Palpations: Abdomen is soft.  Musculoskeletal: Normal range of motion.        General: No tenderness or deformity.  Lymphadenopathy:     Cervical: No cervical adenopathy.  Skin:    General: Skin is warm and dry.     Findings: No erythema or rash.     Comments: There is a faint maculopapular rash on her arms and legs.  She does have the bilateral leg swelling which is chronic.  There is no excoriations.  There is no arm swelling.  Neurological:     Mental Status: She is alert and oriented to person, place, and time.  Psychiatric:        Behavior: Behavior normal.        Thought Content: Thought content normal.        Judgment: Judgment normal.      Lab Results  Component Value Date   WBC 8.1 09/29/2018   HGB 13.3 09/29/2018   HCT 40.6 09/29/2018   MCV 84.4 09/29/2018   PLT 299 09/29/2018   No results found for: FERRITIN, IRON, TIBC, UIBC, IRONPCTSAT Lab Results  Component Value Date   RBC 4.81 09/29/2018   No results found for: KPAFRELGTCHN, LAMBDASER, KAPLAMBRATIO No results found for: IGGSERUM, IGA, IGMSERUM No results found for: Odetta Pink, SPEI   Chemistry      Component Value Date/Time   NA 140 09/29/2018 0945   K 4.1 09/29/2018 0945   CL 103 09/29/2018 0945   CO2 26 09/29/2018 0945   BUN 12 09/29/2018 0945   CREATININE 0.83 09/29/2018 0945      Component Value Date/Time   CALCIUM 9.4 09/29/2018 0945   ALKPHOS 97 09/29/2018 0945   AST 15 09/29/2018 0945   ALT 15 09/29/2018 0945   BILITOT 0.6 09/29/2018 0945       Impression and Plan: Brittany Williamson is a very pleasant 73 yo caucasian female with stage IIIa  (T2N1M0) nodular melanoma of the left shoulder.   This will be her 10th cycle today.  We will have her come back in 1 month for her 11th cycle.  She will make an appointment to see her dermatologist hopefully in a month or so.  With the coronavirus, I am not sure when patients will be able to have routine visits.    Volanda Napoleon, MD 3/30/202010:48 AM

## 2018-09-29 NOTE — Patient Instructions (Signed)
Implanted Port Insertion, Care After  This sheet gives you information about how to care for yourself after your procedure. Your health care provider may also give you more specific instructions. If you have problems or questions, contact your health care provider.  What can I expect after the procedure?  After the procedure, it is common to have:  · Discomfort at the port insertion site.  · Bruising on the skin over the port. This should improve over 3-4 days.  Follow these instructions at home:  Port care  · After your port is placed, you will get a manufacturer's information card. The card has information about your port. Keep this card with you at all times.  · Take care of the port as told by your health care provider. Ask your health care provider if you or a family member can get training for taking care of the port at home. A home health care nurse may also take care of the port.  · Make sure to remember what type of port you have.  Incision care         · Follow instructions from your health care provider about how to take care of your port insertion site. Make sure you:  ? Wash your hands with soap and water before and after you change your bandage (dressing). If soap and water are not available, use hand sanitizer.  ? Change your dressing as told by your health care provider.  ? Leave stitches (sutures), skin glue, or adhesive strips in place. These skin closures may need to stay in place for 2 weeks or longer. If adhesive strip edges start to loosen and curl up, you may trim the loose edges. Do not remove adhesive strips completely unless your health care provider tells you to do that.  · Check your port insertion site every day for signs of infection. Check for:  ? Redness, swelling, or pain.  ? Fluid or blood.  ? Warmth.  ? Pus or a bad smell.  Activity  · Return to your normal activities as told by your health care provider. Ask your health care provider what activities are safe for you.  · Do not  lift anything that is heavier than 10 lb (4.5 kg), or the limit that you are told, until your health care provider says that it is safe.  General instructions  · Take over-the-counter and prescription medicines only as told by your health care provider.  · Do not take baths, swim, or use a hot tub until your health care provider approves. Ask your health care provider if you may take showers. You may only be allowed to take sponge baths.  · Do not drive for 24 hours if you were given a sedative during your procedure.  · Wear a medical alert bracelet in case of an emergency. This will tell any health care providers that you have a port.  · Keep all follow-up visits as told by your health care provider. This is important.  Contact a health care provider if:  · You cannot flush your port with saline as directed, or you cannot draw blood from the port.  · You have a fever or chills.  · You have redness, swelling, or pain around your port insertion site.  · You have fluid or blood coming from your port insertion site.  · Your port insertion site feels warm to the touch.  · You have pus or a bad smell coming from the port   insertion site.  Get help right away if:  · You have chest pain or shortness of breath.  · You have bleeding from your port that you cannot control.  Summary  · Take care of the port as told by your health care provider. Keep the manufacturer's information card with you at all times.  · Change your dressing as told by your health care provider.  · Contact a health care provider if you have a fever or chills or if you have redness, swelling, or pain around your port insertion site.  · Keep all follow-up visits as told by your health care provider.  This information is not intended to replace advice given to you by your health care provider. Make sure you discuss any questions you have with your health care provider.  Document Released: 04/08/2013 Document Revised: 01/14/2018 Document Reviewed:  01/14/2018  Elsevier Interactive Patient Education © 2019 Elsevier Inc[employer]

## 2018-09-29 NOTE — Telephone Encounter (Signed)
Appointments scheduled avs/calendar printed per 3/30 los °

## 2018-09-29 NOTE — Telephone Encounter (Signed)
-----   Message from Volanda Napoleon, MD sent at 09/29/2018  3:29 PM EDT ----- Call - the thyroid is ok!!  Your iron is very low.  You need 1 dose of IV iron with Injectafer.  This will make you feel better!!  [name]

## 2018-09-29 NOTE — Telephone Encounter (Signed)
Message to scheduling. 

## 2018-09-29 NOTE — Telephone Encounter (Signed)
Notified pt of lab results and need for IV iron. PT verbalized understanding.

## 2018-09-30 ENCOUNTER — Other Ambulatory Visit: Payer: Self-pay | Admitting: Family

## 2018-09-30 ENCOUNTER — Telehealth: Payer: Self-pay | Admitting: Family

## 2018-09-30 DIAGNOSIS — D508 Other iron deficiency anemias: Secondary | ICD-10-CM

## 2018-09-30 DIAGNOSIS — D509 Iron deficiency anemia, unspecified: Secondary | ICD-10-CM | POA: Insufficient documentation

## 2018-09-30 NOTE — Telephone Encounter (Signed)
LMVM for patient with date/time of appointment per 3/30 staff message

## 2018-10-01 ENCOUNTER — Telehealth: Payer: Self-pay | Admitting: *Deleted

## 2018-10-01 ENCOUNTER — Ambulatory Visit: Payer: Medicare HMO

## 2018-10-01 NOTE — Telephone Encounter (Signed)
Called patient. Left VM at home #. Patient scheduled here today for chemotherapy @ 10:00 am. Has not shown as of 10:40

## 2018-10-27 ENCOUNTER — Other Ambulatory Visit: Payer: Self-pay

## 2018-10-27 ENCOUNTER — Inpatient Hospital Stay (HOSPITAL_BASED_OUTPATIENT_CLINIC_OR_DEPARTMENT_OTHER): Payer: Medicare HMO | Admitting: Family

## 2018-10-27 ENCOUNTER — Encounter: Payer: Self-pay | Admitting: Family

## 2018-10-27 ENCOUNTER — Inpatient Hospital Stay: Payer: Medicare HMO

## 2018-10-27 ENCOUNTER — Inpatient Hospital Stay: Payer: Medicare HMO | Attending: Hematology & Oncology

## 2018-10-27 VITALS — BP 143/75 | HR 85 | Temp 97.5°F | Resp 17 | Ht 65.0 in | Wt 238.1 lb

## 2018-10-27 DIAGNOSIS — C4362 Malignant melanoma of left upper limb, including shoulder: Secondary | ICD-10-CM

## 2018-10-27 DIAGNOSIS — Z9181 History of falling: Secondary | ICD-10-CM | POA: Diagnosis not present

## 2018-10-27 DIAGNOSIS — R0609 Other forms of dyspnea: Secondary | ICD-10-CM

## 2018-10-27 DIAGNOSIS — E039 Hypothyroidism, unspecified: Secondary | ICD-10-CM | POA: Insufficient documentation

## 2018-10-27 DIAGNOSIS — D508 Other iron deficiency anemias: Secondary | ICD-10-CM

## 2018-10-27 DIAGNOSIS — Z5112 Encounter for antineoplastic immunotherapy: Secondary | ICD-10-CM | POA: Diagnosis not present

## 2018-10-27 DIAGNOSIS — R5383 Other fatigue: Secondary | ICD-10-CM

## 2018-10-27 LAB — CMP (CANCER CENTER ONLY)
ALT: 14 U/L (ref 0–44)
AST: 14 U/L — ABNORMAL LOW (ref 15–41)
Albumin: 4.3 g/dL (ref 3.5–5.0)
Alkaline Phosphatase: 103 U/L (ref 38–126)
Anion gap: 12 (ref 5–15)
BUN: 19 mg/dL (ref 8–23)
CO2: 23 mmol/L (ref 22–32)
Calcium: 9.8 mg/dL (ref 8.9–10.3)
Chloride: 104 mmol/L (ref 98–111)
Creatinine: 0.86 mg/dL (ref 0.44–1.00)
GFR, Est AFR Am: >60 mL/min (ref >60)
GFR, Estimated: >60 mL/min (ref >60)
Glucose, Bld: 133 mg/dL — ABNORMAL HIGH (ref 70–99)
Potassium: 3.8 mmol/L (ref 3.5–5.1)
Sodium: 139 mmol/L (ref 135–145)
Total Bilirubin: 0.5 mg/dL (ref 0.3–1.2)
Total Protein: 7.6 g/dL (ref 6.5–8.1)

## 2018-10-27 LAB — CBC WITH DIFFERENTIAL (CANCER CENTER ONLY)
Abs Immature Granulocytes: 0.03 10*3/uL (ref 0.00–0.07)
Basophils Absolute: 0.1 10*3/uL (ref 0.0–0.1)
Basophils Relative: 1 %
Eosinophils Absolute: 0.3 10*3/uL (ref 0.0–0.5)
Eosinophils Relative: 3 %
HCT: 41.5 % (ref 36.0–46.0)
Hemoglobin: 13.4 g/dL (ref 12.0–15.0)
Immature Granulocytes: 0 %
Lymphocytes Relative: 33 %
Lymphs Abs: 2.9 10*3/uL (ref 0.7–4.0)
MCH: 27.3 pg (ref 26.0–34.0)
MCHC: 32.3 g/dL (ref 30.0–36.0)
MCV: 84.5 fL (ref 80.0–100.0)
Monocytes Absolute: 0.5 10*3/uL (ref 0.1–1.0)
Monocytes Relative: 6 %
Neutro Abs: 4.9 10*3/uL (ref 1.7–7.7)
Neutrophils Relative %: 57 %
Platelet Count: 307 10*3/uL (ref 150–400)
RBC: 4.91 MIL/uL (ref 3.87–5.11)
RDW: 14.5 % (ref 11.5–15.5)
WBC Count: 8.6 10*3/uL (ref 4.0–10.5)
nRBC: 0 % (ref 0.0–0.2)

## 2018-10-27 LAB — TSH: TSH: 2.988 u[IU]/mL (ref 0.308–3.960)

## 2018-10-27 LAB — LACTATE DEHYDROGENASE: LDH: 197 U/L — ABNORMAL HIGH (ref 98–192)

## 2018-10-27 MED ORDER — HEPARIN SOD (PORK) LOCK FLUSH 100 UNIT/ML IV SOLN
500.0000 [IU] | Freq: Once | INTRAVENOUS | Status: AC | PRN
  Administered 2018-10-27: 500 [IU]
  Filled 2018-10-27: qty 5

## 2018-10-27 MED ORDER — SODIUM CHLORIDE 0.9 % IV SOLN
750.0000 mg | Freq: Once | INTRAVENOUS | Status: AC
  Administered 2018-10-27: 11:00:00 750 mg via INTRAVENOUS
  Filled 2018-10-27: qty 15

## 2018-10-27 MED ORDER — SODIUM CHLORIDE 0.9% FLUSH
10.0000 mL | INTRAVENOUS | Status: DC | PRN
  Administered 2018-10-27: 10 mL
  Filled 2018-10-27: qty 10

## 2018-10-27 MED ORDER — SODIUM CHLORIDE 0.9 % IV SOLN
480.0000 mg | Freq: Once | INTRAVENOUS | Status: AC
  Administered 2018-10-27: 12:00:00 480 mg via INTRAVENOUS
  Filled 2018-10-27: qty 48

## 2018-10-27 MED ORDER — SODIUM CHLORIDE 0.9 % IV SOLN
Freq: Once | INTRAVENOUS | Status: AC
  Administered 2018-10-27: 11:00:00 via INTRAVENOUS
  Filled 2018-10-27: qty 250

## 2018-10-27 NOTE — Patient Instructions (Signed)

## 2018-10-27 NOTE — Progress Notes (Signed)
Hematology and Oncology Follow Up Visit  Brittany Williamson [id number] 1945-12-28 73 y.o. 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 She is feeling quite fatigued and has noted SOB with exertion as well as some dizziness for the last several weeks.  She states that she tripped and fell down the stairs yesterday carrying laundry. Thankfully, she states that she was not seriously injured. Her iron saturation at her last visit was low so she will get IV iron today.  TSH drawn today is pending. Last level was stable at 2.421.  No fever, chills, n/v, cough, rash, chest pain, palpitations, abdominal pain or changes in bowel or bladder habits.  No swelling, tenderness, numbness or tingling in her extremities at this time.  No lymphadenopathy noted on exam.  No episodes of bleeding, no bruising or petechiae.  She has maintained a good appetite and is staying well hydrated. Her weight is stable.   ECOG Performance Status: 1 - Symptomatic but completely ambulatory  Medications:  Allergies as of 10/27/2018      Reactions   Bee Venom Other (See Comments)   Penicillins Rash   Beeswax Other (See Comments)   Bee stings. Bee stings. Bee stings. Bee stings.      Medication List       Accurate as of October 27, 2018 10:04 AM. Always use your most recent med list.        atorvastatin 20 MG tablet Commonly known as:  LIPITOR Take 20 mg by mouth daily.   butalbital-aspirin-caffeine 50-325-40 MG capsule Commonly known as:  Fiorinal Take 1 capsule by mouth every 6 (six) hours as needed for headache.   carvedilol 3.125 MG tablet Commonly known as:  COREG Take 3.125 mg by mouth 2 (two) times daily.    Diethylpropion HCl CR 75 MG Tb24 Take 75 mg by mouth daily.   doxycycline 100 MG tablet Commonly known as:  VIBRA-TABS Take 1 tablet (100 mg total) by mouth 2 (two) times daily.   DULoxetine 30 MG capsule Commonly known as:  CYMBALTA Take 30 mg by mouth daily.   levothyroxine 75 MCG tablet Commonly known as:  Synthroid Take 1 tablet (75 mcg total) by mouth daily before breakfast.   lidocaine-prilocaine cream Commonly known as:  EMLA Apply 1 application topically as needed. Apply 1 hr prior to pac access and cover with plastic wrap   methylPREDNISolone 4 MG Tbpk tablet Commonly known as:  MEDROL DOSEPAK Take as directed on package.   nystatin cream Commonly known as:  MYCOSTATIN Apply 1 application topically 2 (two) times daily.   valsartan-hydrochlorothiazide 80-12.5 MG tablet Commonly known as:  DIOVAN-HCT       Allergies:  Allergies  Allergen Reactions  . Bee Venom Other (See Comments)  . Penicillins Rash  . Beeswax Other (See Comments)    Bee stings. Bee stings.  Bee stings. Bee stings.    Past Medical History, Surgical history, Social history, and Family History were reviewed and updated.  Review of Systems: All other 10 point review of systems is negative.   Physical Exam:  height is 5\' 5"  (1.651 m) and weight is 238 lb 1.6 oz (108 kg). Her oral temperature is 97.5 F (36.4 C) (abnormal). Her blood pressure  is 143/75 (abnormal) and her pulse is 85. Her respiration is 17 and oxygen saturation is 96%.   Wt Readings from Last 3 Encounters:  10/27/18 238 lb 1.6 oz (108 kg)  09/29/18 239 lb (108.4 kg)  09/01/18 243 lb (110.2 kg)    Ocular: Sclerae unicteric, pupils equal, round and reactive to light Ear-nose-throat: Oropharynx clear, dentition fair Lymphatic: No cervical or supraclavicular adenopathy Lungs no rales or rhonchi, good excursion bilaterally Heart regular rate and rhythm, no murmur appreciated Abd soft, nontender, positive bowel sounds,  no liver or spleen tip palpated on exam, no fluid wave  MSK no focal spinal tenderness, no joint edema Neuro: non-focal, well-oriented, appropriate affect Breasts: Deferred   Lab Results  Component Value Date   WBC 8.6 10/27/2018   HGB 13.4 10/27/2018   HCT 41.5 10/27/2018   MCV 84.5 10/27/2018   PLT 307 10/27/2018   Lab Results  Component Value Date   FERRITIN 38 09/29/2018   IRON 62 09/29/2018   TIBC 360 09/29/2018   UIBC 298 09/29/2018   IRONPCTSAT 17 (L) 09/29/2018   Lab Results  Component Value Date   RBC 4.91 10/27/2018   No results found for: KPAFRELGTCHN, LAMBDASER, KAPLAMBRATIO No results found for: IGGSERUM, IGA, IGMSERUM No results found for: Odetta Pink, SPEI   Chemistry      Component Value Date/Time   NA 140 09/29/2018 0945   K 4.1 09/29/2018 0945   CL 103 09/29/2018 0945   CO2 26 09/29/2018 0945   BUN 12 09/29/2018 0945   CREATININE 0.83 09/29/2018 0945      Component Value Date/Time   CALCIUM 9.4 09/29/2018 0945   ALKPHOS 97 09/29/2018 0945   AST 15 09/29/2018 0945   ALT 15 09/29/2018 0945   BILITOT 0.6 09/29/2018 0945       Impression and Plan: Brittany Williamson is a very pleasant 73 yo caucasian female with stage IIIa (T2N1M0) nodular melanoma of the left shoulder.  We will proceed with cycle 11 today as planned.  She will also get IV iron today.  TSH is pending. We will see if her synthroid needs to be adjusted.  We will plan to see her back in another month.  She will contact our office with any questions or concerns. We can certainly see her sooner if need be.   Laverna Peace, NP 4/27/202010:04 AM

## 2018-10-27 NOTE — Patient Instructions (Signed)

## 2018-11-05 ENCOUNTER — Other Ambulatory Visit: Payer: Self-pay | Admitting: Family

## 2018-11-05 ENCOUNTER — Other Ambulatory Visit: Payer: Self-pay

## 2018-11-05 ENCOUNTER — Ambulatory Visit (HOSPITAL_COMMUNITY)
Admission: RE | Admit: 2018-11-05 | Discharge: 2018-11-05 | Disposition: A | Discharge status: Home / Self Care | Payer: Medicare HMO | Source: Ambulatory Visit | Attending: Family | Admitting: Family

## 2018-11-05 DIAGNOSIS — C4362 Malignant melanoma of left upper limb, including shoulder: Secondary | ICD-10-CM | POA: Diagnosis present

## 2018-11-05 DIAGNOSIS — E032 Hypothyroidism due to medicaments and other exogenous substances: Secondary | ICD-10-CM

## 2018-11-05 LAB — GLUCOSE, CAPILLARY: Glucose-Capillary: 106 mg/dL — ABNORMAL HIGH (ref 70–99)

## 2018-11-05 MED ORDER — FLUDEOXYGLUCOSE F - 18 (FDG) INJECTION
11.9200 | Freq: Once | INTRAVENOUS | Status: AC | PRN
  Administered 2018-11-05: 11.92 via INTRAVENOUS

## 2018-11-06 ENCOUNTER — Telehealth: Payer: Self-pay | Admitting: *Deleted

## 2018-11-06 ENCOUNTER — Other Ambulatory Visit: Payer: Self-pay | Admitting: *Deleted

## 2018-11-06 DIAGNOSIS — E032 Hypothyroidism due to medicaments and other exogenous substances: Secondary | ICD-10-CM

## 2018-11-06 DIAGNOSIS — C4362 Malignant melanoma of left upper limb, including shoulder: Secondary | ICD-10-CM

## 2018-11-06 MED ORDER — LEVOTHYROXINE SODIUM 75 MCG PO TABS: ORAL_TABLET | ORAL | 4 refills | Status: DC

## 2018-11-06 NOTE — Telephone Encounter (Addendum)
Patient is aware of results  ----- Message from Volanda Napoleon, MD sent at 11/06/2018  9:37 AM EDT ----- Call - NO obvious melanoma on the PET scan!!  [facility]

## 2018-11-25 ENCOUNTER — Inpatient Hospital Stay: Payer: Medicare HMO

## 2018-11-25 ENCOUNTER — Other Ambulatory Visit: Payer: Self-pay

## 2018-11-25 ENCOUNTER — Encounter: Payer: Self-pay | Admitting: Hematology & Oncology

## 2018-11-25 ENCOUNTER — Inpatient Hospital Stay: Payer: Medicare HMO | Attending: Hematology & Oncology | Admitting: Hematology & Oncology

## 2018-11-25 VITALS — BP 124/70 | HR 82 | Temp 98.1°F | Resp 17 | Wt 235.0 lb

## 2018-11-25 DIAGNOSIS — C4362 Malignant melanoma of left upper limb, including shoulder: Secondary | ICD-10-CM | POA: Diagnosis present

## 2018-11-25 DIAGNOSIS — R600 Localized edema: Secondary | ICD-10-CM | POA: Diagnosis not present

## 2018-11-25 DIAGNOSIS — R21 Rash and other nonspecific skin eruption: Secondary | ICD-10-CM

## 2018-11-25 DIAGNOSIS — Z5112 Encounter for antineoplastic immunotherapy: Secondary | ICD-10-CM | POA: Diagnosis present

## 2018-11-25 DIAGNOSIS — Z79899 Other long term (current) drug therapy: Secondary | ICD-10-CM | POA: Diagnosis not present

## 2018-11-25 DIAGNOSIS — D508 Other iron deficiency anemias: Secondary | ICD-10-CM

## 2018-11-25 LAB — CBC WITH DIFFERENTIAL (CANCER CENTER ONLY)
Abs Immature Granulocytes: 0.02 10*3/uL (ref 0.00–0.07)
Basophils Absolute: 0.1 10*3/uL (ref 0.0–0.1)
Basophils Relative: 1 %
Eosinophils Absolute: 0.3 10*3/uL (ref 0.0–0.5)
Eosinophils Relative: 3 %
HCT: 42.9 % (ref 36.0–46.0)
Hemoglobin: 13.8 g/dL (ref 12.0–15.0)
Immature Granulocytes: 0 %
Lymphocytes Relative: 33 %
Lymphs Abs: 3 10*3/uL (ref 0.7–4.0)
MCH: 27.5 pg (ref 26.0–34.0)
MCHC: 32.2 g/dL (ref 30.0–36.0)
MCV: 85.5 fL (ref 80.0–100.0)
Monocytes Absolute: 0.6 10*3/uL (ref 0.1–1.0)
Monocytes Relative: 7 %
Neutro Abs: 5.2 10*3/uL (ref 1.7–7.7)
Neutrophils Relative %: 56 %
Platelet Count: 271 10*3/uL (ref 150–400)
RBC: 5.02 MIL/uL (ref 3.87–5.11)
RDW: 15.4 % (ref 11.5–15.5)
WBC Count: 9.1 10*3/uL (ref 4.0–10.5)
nRBC: 0 % (ref 0.0–0.2)

## 2018-11-25 LAB — LACTATE DEHYDROGENASE: LDH: 242 U/L — ABNORMAL HIGH (ref 98–192)

## 2018-11-25 LAB — CMP (CANCER CENTER ONLY)
ALT: 16 U/L (ref 0–44)
AST: 15 U/L (ref 15–41)
Albumin: 4.5 g/dL (ref 3.5–5.0)
Alkaline Phosphatase: 109 U/L (ref 38–126)
Anion gap: 9 (ref 5–15)
BUN: 18 mg/dL (ref 8–23)
CO2: 25 mmol/L (ref 22–32)
Calcium: 9.2 mg/dL (ref 8.9–10.3)
Chloride: 105 mmol/L (ref 98–111)
Creatinine: 0.85 mg/dL (ref 0.44–1.00)
GFR, Est AFR Am: >60 mL/min (ref >60)
GFR, Estimated: >60 mL/min (ref >60)
Glucose, Bld: 116 mg/dL — ABNORMAL HIGH (ref 70–99)
Potassium: 4 mmol/L (ref 3.5–5.1)
Sodium: 139 mmol/L (ref 135–145)
Total Bilirubin: 0.5 mg/dL (ref 0.3–1.2)
Total Protein: 7 g/dL (ref 6.5–8.1)

## 2018-11-25 MED ORDER — SODIUM CHLORIDE 0.9 % IV SOLN
Freq: Once | INTRAVENOUS | Status: AC
  Administered 2018-11-25: 13:00:00 via INTRAVENOUS
  Filled 2018-11-25: qty 250

## 2018-11-25 MED ORDER — SODIUM CHLORIDE 0.9 % IV SOLN
480.0000 mg | Freq: Once | INTRAVENOUS | Status: AC
  Administered 2018-11-25: 480 mg via INTRAVENOUS
  Filled 2018-11-25: qty 48

## 2018-11-25 MED ORDER — HEPARIN SOD (PORK) LOCK FLUSH 100 UNIT/ML IV SOLN
500.0000 [IU] | Freq: Once | INTRAVENOUS | Status: AC | PRN
  Administered 2018-11-25: 500 [IU]
  Filled 2018-11-25: qty 5

## 2018-11-25 MED ORDER — MUPIROCIN CALCIUM 2 % EX CREA: 1.0000 "application " | TOPICAL_CREAM | Freq: Three times a day (TID) | CUTANEOUS | 1 refills | Status: DC

## 2018-11-25 MED ORDER — SODIUM CHLORIDE 0.9% FLUSH
10.0000 mL | INTRAVENOUS | Status: DC | PRN
  Administered 2018-11-25: 10 mL
  Filled 2018-11-25: qty 10

## 2018-11-25 NOTE — Patient Instructions (Signed)
Implanted Port Insertion, Care After  This sheet gives you information about how to care for yourself after your procedure. Your health care provider may also give you more specific instructions. If you have problems or questions, contact your health care provider.  What can I expect after the procedure?  After the procedure, it is common to have:  · Discomfort at the port insertion site.  · Bruising on the skin over the port. This should improve over 3-4 days.  Follow these instructions at home:  Port care  · After your port is placed, you will get a manufacturer's information card. The card has information about your port. Keep this card with you at all times.  · Take care of the port as told by your health care provider. Ask your health care provider if you or a family member can get training for taking care of the port at home. A home health care nurse may also take care of the port.  · Make sure to remember what type of port you have.  Incision care         · Follow instructions from your health care provider about how to take care of your port insertion site. Make sure you:  ? Wash your hands with soap and water before and after you change your bandage (dressing). If soap and water are not available, use hand sanitizer.  ? Change your dressing as told by your health care provider.  ? Leave stitches (sutures), skin glue, or adhesive strips in place. These skin closures may need to stay in place for 2 weeks or longer. If adhesive strip edges start to loosen and curl up, you may trim the loose edges. Do not remove adhesive strips completely unless your health care provider tells you to do that.  · Check your port insertion site every day for signs of infection. Check for:  ? Redness, swelling, or pain.  ? Fluid or blood.  ? Warmth.  ? Pus or a bad smell.  Activity  · Return to your normal activities as told by your health care provider. Ask your health care provider what activities are safe for you.  · Do not  lift anything that is heavier than 10 lb (4.5 kg), or the limit that you are told, until your health care provider says that it is safe.  General instructions  · Take over-the-counter and prescription medicines only as told by your health care provider.  · Do not take baths, swim, or use a hot tub until your health care provider approves. Ask your health care provider if you may take showers. You may only be allowed to take sponge baths.  · Do not drive for 24 hours if you were given a sedative during your procedure.  · Wear a medical alert bracelet in case of an emergency. This will tell any health care providers that you have a port.  · Keep all follow-up visits as told by your health care provider. This is important.  Contact a health care provider if:  · You cannot flush your port with saline as directed, or you cannot draw blood from the port.  · You have a fever or chills.  · You have redness, swelling, or pain around your port insertion site.  · You have fluid or blood coming from your port insertion site.  · Your port insertion site feels warm to the touch.  · You have pus or a bad smell coming from the port   insertion site.  Get help right away if:  · You have chest pain or shortness of breath.  · You have bleeding from your port that you cannot control.  Summary  · Take care of the port as told by your health care provider. Keep the manufacturer's information card with you at all times.  · Change your dressing as told by your health care provider.  · Contact a health care provider if you have a fever or chills or if you have redness, swelling, or pain around your port insertion site.  · Keep all follow-up visits as told by your health care provider.  This information is not intended to replace advice given to you by your health care provider. Make sure you discuss any questions you have with your health care provider.  Document Released: 04/08/2013 Document Revised: 01/14/2018 Document Reviewed:  01/14/2018  Elsevier Interactive Patient Education © 2019 Elsevier Inc[employer]

## 2018-11-25 NOTE — Progress Notes (Signed)
Hematology and Oncology Follow Up Visit  Kiernan Farkas [id number] Oct 21, 1945 73 y.o. 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 Brickhouse is here today for follow-up and treatment.this will be her last cycle of therapy.  She is done incredibly well.  She now has rash on her lower leg.  This is on the left lower leg.  She has had it for a couple weeks.  It is spotty.  She says it itches a little bit.  It does not look obviously infected.  She says that has had some bleeding.  She does have some chronic edema in her lower legs.  Otherwise, she has had no problems.  There is been no cough.  She has had no shortness of breath.  She has had no change in bowel or bladder habits.  She has had no headache.  There is been no mouth sores.  Overall, her performance status is ECOG 1.  Medications:  Allergies as of 11/25/2018      Reactions   Bee Venom Other (See Comments)   Penicillins Rash   Beeswax Other (See Comments)   Bee stings. Bee stings. Bee stings. Bee stings.      Medication List       Accurate as of Nov 25, 2018  1:01 PM. If you have any questions, ask your nurse or doctor.        STOP taking these medications   doxycycline 100 MG tablet Commonly known as:  VIBRA-TABS Stopped by:  Volanda Napoleon, MD     TAKE these medications   atorvastatin 20 MG tablet Commonly known as:  LIPITOR Take 20 mg by mouth daily.   butalbital-aspirin-caffeine 50-325-40 MG capsule Commonly known as:  Fiorinal Take 1 capsule by mouth every 6 (six) hours as needed for headache.   carvedilol 3.125 MG tablet Commonly known as:  COREG Take 3.125 mg by mouth 2 (two) times daily.   Diethylpropion HCl CR 75 MG Tb24 Take 75 mg  by mouth daily.   DULoxetine 30 MG capsule Commonly known as:  CYMBALTA Take 30 mg by mouth daily.   levothyroxine 75 MCG tablet Commonly known as:  SYNTHROID TAKE 1 TABLET(75 MCG) BY MOUTH DAILY BEFORE BREAKFAST   lidocaine-prilocaine cream Commonly known as:  EMLA Apply 1 application topically as needed. Apply 1 hr prior to pac access and cover with plastic wrap   methylPREDNISolone 4 MG Tbpk tablet Commonly known as:  MEDROL DOSEPAK Take as directed on package.   mupirocin cream 2 % Commonly known as:  Bactroban Apply 1 application topically 3 (three) times daily. Started by:  Volanda Napoleon, MD   nystatin cream Commonly known as:  MYCOSTATIN Apply 1 application topically 2 (two) times daily.   valsartan-hydrochlorothiazide 80-12.5 MG tablet Commonly known as:  DIOVAN-HCT       Allergies:  Allergies  Allergen Reactions  . Bee Venom Other (See Comments)  . Penicillins Rash  . Beeswax Other (See Comments)    Bee stings. Bee stings.  Bee stings. Bee stings.    Past Medical History, Surgical history, Social history, and Family History were reviewed and updated.  Review of Systems:  Review of Systems  Constitutional: Negative.   HENT: Negative.   Eyes: Negative.  Respiratory: Negative.   Cardiovascular: Negative.   Gastrointestinal: Negative.   Genitourinary: Negative.   Musculoskeletal: Negative.   Skin: Positive for rash.  Neurological: Negative.   Endo/Heme/Allergies: Negative.   Psychiatric/Behavioral: Negative.      Physical Exam:  weight is 235 lb (106.6 kg). Her oral temperature is 98.1 F (36.7 C). Her blood pressure is 124/70 and her pulse is 82. Her respiration is 17 and oxygen saturation is 96%.   Wt Readings from Last 3 Encounters:  11/25/18 235 lb (106.6 kg)  10/27/18 238 lb 1.6 oz (108 kg)  09/29/18 239 lb (108.4 kg)    Physical Exam Vitals signs reviewed.  HENT:     Head: Normocephalic and atraumatic.  Eyes:     Pupils:  Pupils are equal, round, and reactive to light.  Neck:     Musculoskeletal: Normal range of motion.  Cardiovascular:     Rate and Rhythm: Normal rate and regular rhythm.     Heart sounds: Normal heart sounds.  Pulmonary:     Effort: Pulmonary effort is normal.     Breath sounds: Normal breath sounds.  Abdominal:     General: Bowel sounds are normal.     Palpations: Abdomen is soft.  Musculoskeletal: Normal range of motion.        General: No tenderness or deformity.  Lymphadenopathy:     Cervical: No cervical adenopathy.  Skin:    General: Skin is warm and dry.     Findings: No erythema or rash.     Comments: On her left lower leg, she does have this macular type rash.  Couple areas have central eschars.  There is some bleeding noted.  There is no exudate.  Neurological:     Mental Status: She is alert and oriented to person, place, and time.  Psychiatric:        Behavior: Behavior normal.        Thought Content: Thought content normal.        Judgment: Judgment normal.      Lab Results  Component Value Date   WBC 9.1 11/25/2018   HGB 13.8 11/25/2018   HCT 42.9 11/25/2018   MCV 85.5 11/25/2018   PLT 271 11/25/2018   Lab Results  Component Value Date   FERRITIN 38 09/29/2018   IRON 62 09/29/2018   TIBC 360 09/29/2018   UIBC 298 09/29/2018   IRONPCTSAT 17 (L) 09/29/2018   Lab Results  Component Value Date   RBC 5.02 11/25/2018   No results found for: KPAFRELGTCHN, LAMBDASER, KAPLAMBRATIO No results found for: IGGSERUM, IGA, IGMSERUM No results found for: Odetta Pink, SPEI   Chemistry      Component Value Date/Time   NA 139 11/25/2018 1133   K 4.0 11/25/2018 1133   CL 105 11/25/2018 1133   CO2 25 11/25/2018 1133   BUN 18 11/25/2018 1133   CREATININE 0.85 11/25/2018 1133      Component Value Date/Time   CALCIUM 9.2 11/25/2018 1133   ALKPHOS 109 11/25/2018 1133   AST 15 11/25/2018 1133   ALT 16  11/25/2018 1133   BILITOT 0.5 11/25/2018 1133       Impression and Plan: Ms. Dermody is a very pleasant 74 yo caucasian female with stage IIIa (T2N1M0) nodular melanoma of the left shoulder.   Again, this will be her last cycle of treatment.  We will follow-up with her.  We will probably get a PET scan on.  Would  be helpful.  I would see about getting a PET scan on her in maybe 6 weeks or so.  We will then get her back in 2 months.  Volanda Napoleon, MD 5/26/20201:01 PM

## 2018-11-25 NOTE — Patient Instructions (Signed)
Nivolumab injection What is this medicine? NIVOLUMAB (nye VOL ue mab) is a monoclonal antibody. It is used to treat melanoma, lung cancer, kidney cancer, head and neck cancer, Hodgkin lymphoma, urothelial cancer, colon cancer, and liver cancer. This medicine may be used for other purposes; ask your health care provider or pharmacist if you have questions. COMMON BRAND NAME(S): Opdivo What should I tell my health care provider before I take this medicine? They need to know if you have any of these conditions: -diabetes -immune system problems -kidney disease -liver disease -lung disease -organ transplant -stomach or intestine problems -thyroid disease -an unusual or allergic reaction to nivolumab, other medicines, foods, dyes, or preservatives -pregnant or trying to get pregnant -breast-feeding How should I use this medicine? This medicine is for infusion into a vein. It is given by a health care professional in a hospital or clinic setting. A special MedGuide will be given to you before each treatment. Be sure to read this information carefully each time. Talk to your pediatrician regarding the use of this medicine in children. While this drug may be prescribed for children as young as 12 years for selected conditions, precautions do apply. Overdosage: If you think you have taken too much of this medicine contact a poison control center or emergency room at once. NOTE: This medicine is only for you. Do not share this medicine with others. What if I miss a dose? It is important not to miss your dose. Call your doctor or health care professional if you are unable to keep an appointment. What may interact with this medicine? Interactions have not been studied. Give your health care provider a list of all the medicines, herbs, non-prescription drugs, or dietary supplements you use. Also tell them if you smoke, drink alcohol, or use illegal drugs. Some items may interact with your  medicine. This list may not describe all possible interactions. Give your health care provider a list of all the medicines, herbs, non-prescription drugs, or dietary supplements you use. Also tell them if you smoke, drink alcohol, or use illegal drugs. Some items may interact with your medicine. What should I watch for while using this medicine? This drug may make you feel generally unwell. Continue your course of treatment even though you feel ill unless your doctor tells you to stop. You may need blood work done while you are taking this medicine. Do not become pregnant while taking this medicine or for 5 months after stopping it. Women should inform their doctor if they wish to become pregnant or think they might be pregnant. There is a potential for serious side effects to an unborn child. Talk to your health care professional or pharmacist for more information. Do not breast-feed an infant while taking this medicine or for 5 months after stopping it. What side effects may I notice from receiving this medicine? Side effects that you should report to your doctor or health care professional as soon as possible: -allergic reactions like skin rash, itching or hives, swelling of the face, lips, or tongue -breathing problems -blood in the urine -bloody or watery diarrhea or black, tarry stools -changes in emotions or moods -changes in vision -chest pain -cough -dizziness -feeling faint or lightheaded, falls -fever, chills -headache with fever, neck stiffness, confusion, loss of memory, sensitivity to light, hallucination, loss of contact with reality, or seizures -joint pain -mouth sores -redness, blistering, peeling or loosening of the skin, including inside the mouth -severe muscle pain or weakness -signs and symptoms of   high blood sugar such as dizziness; dry mouth; dry skin; fruity breath; nausea; stomach pain; increased hunger or thirst; increased urination -signs and symptoms of kidney  injury like trouble passing urine or change in the amount of urine -signs and symptoms of liver injury like dark yellow or brown urine; general ill feeling or flu-like symptoms; light-colored stools; loss of appetite; nausea; right upper belly pain; unusually weak or tired; yellowing of the eyes or skin -swelling of the ankles, feet, hands -trouble passing urine or change in the amount of urine -unusually weak or tired -weight gain or loss Side effects that usually do not require medical attention (report to your doctor or health care professional if they continue or are bothersome): -bone pain -constipation -decreased appetite -diarrhea -muscle pain -nausea, vomiting -tiredness This list may not describe all possible side effects. Call your doctor for medical advice about side effects. You may report side effects to FDA at 1-800-FDA-1088. Where should I keep my medicine? This drug is given in a hospital or clinic and will not be stored at home. NOTE: This sheet is a summary. It may not cover all possible information. If you have questions about this medicine, talk to your doctor, pharmacist, or health care provider.  2019 Elsevier/Gold Standard (2017-11-06 12:55:04)  

## 2018-11-26 LAB — FERRITIN: Ferritin: 263 ng/mL (ref 11–307)

## 2018-11-26 LAB — IRON AND TIBC
Iron: 68 ug/dL (ref 41–142)
Saturation Ratios: 22 % (ref 21–57)
TIBC: 303 ug/dL (ref 236–444)
UIBC: 235 ug/dL (ref 120–384)

## 2018-11-26 LAB — TSH: TSH: 2.925 u[IU]/mL (ref 0.308–3.960)

## 2018-12-03 ENCOUNTER — Telehealth: Payer: Self-pay | Admitting: *Deleted

## 2018-12-03 NOTE — Telephone Encounter (Signed)
Call received from patient concerned about PET scan scheduled for 01/01/19 d/t PET scan was done on 11/05/18.  Dr. Marin Olp notified and order received to cancel PET scan.  Call placed back to patient and patient notified that PET scan will be canceled.

## 2018-12-09 ENCOUNTER — Other Ambulatory Visit: Payer: Self-pay

## 2018-12-09 MED ORDER — MUPIROCIN CALCIUM 2 % EX CREA: 1.0000 "application " | TOPICAL_CREAM | Freq: Three times a day (TID) | CUTANEOUS | 3 refills | Status: AC

## 2019-01-26 ENCOUNTER — Encounter: Payer: Self-pay | Admitting: Hematology & Oncology

## 2019-01-26 ENCOUNTER — Other Ambulatory Visit: Payer: Self-pay

## 2019-01-26 ENCOUNTER — Inpatient Hospital Stay: Payer: Medicare HMO

## 2019-01-26 ENCOUNTER — Ambulatory Visit: Payer: Medicare HMO

## 2019-01-26 ENCOUNTER — Inpatient Hospital Stay: Payer: Medicare HMO | Attending: Hematology & Oncology | Admitting: Hematology & Oncology

## 2019-01-26 VITALS — BP 157/67 | HR 83 | Temp 97.5°F | Resp 20 | Wt 233.0 lb

## 2019-01-26 DIAGNOSIS — Z95828 Presence of other vascular implants and grafts: Secondary | ICD-10-CM

## 2019-01-26 DIAGNOSIS — C4362 Malignant melanoma of left upper limb, including shoulder: Secondary | ICD-10-CM

## 2019-01-26 LAB — CBC WITH DIFFERENTIAL (CANCER CENTER ONLY)
Abs Immature Granulocytes: 0.02 10*3/uL (ref 0.00–0.07)
Basophils Absolute: 0 10*3/uL (ref 0.0–0.1)
Basophils Relative: 1 %
Eosinophils Absolute: 0.2 10*3/uL (ref 0.0–0.5)
Eosinophils Relative: 2 %
HCT: 41.3 % (ref 36.0–46.0)
Hemoglobin: 13.5 g/dL (ref 12.0–15.0)
Immature Granulocytes: 0 %
Lymphocytes Relative: 37 %
Lymphs Abs: 3.3 10*3/uL (ref 0.7–4.0)
MCH: 28.4 pg (ref 26.0–34.0)
MCHC: 32.7 g/dL (ref 30.0–36.0)
MCV: 86.9 fL (ref 80.0–100.0)
Monocytes Absolute: 0.6 10*3/uL (ref 0.1–1.0)
Monocytes Relative: 7 %
Neutro Abs: 4.6 10*3/uL (ref 1.7–7.7)
Neutrophils Relative %: 53 %
Platelet Count: 259 10*3/uL (ref 150–400)
RBC: 4.75 MIL/uL (ref 3.87–5.11)
RDW: 14.3 % (ref 11.5–15.5)
WBC Count: 8.7 10*3/uL (ref 4.0–10.5)
nRBC: 0 % (ref 0.0–0.2)

## 2019-01-26 LAB — CMP (CANCER CENTER ONLY)
ALT: 13 U/L (ref 0–44)
AST: 14 U/L — ABNORMAL LOW (ref 15–41)
Albumin: 4.2 g/dL (ref 3.5–5.0)
Alkaline Phosphatase: 84 U/L (ref 38–126)
Anion gap: 8 (ref 5–15)
BUN: 14 mg/dL (ref 8–23)
CO2: 27 mmol/L (ref 22–32)
Calcium: 8.5 mg/dL — ABNORMAL LOW (ref 8.9–10.3)
Chloride: 104 mmol/L (ref 98–111)
Creatinine: 0.74 mg/dL (ref 0.44–1.00)
GFR, Est AFR Am: >60 mL/min (ref >60)
GFR, Estimated: >60 mL/min (ref >60)
Glucose, Bld: 99 mg/dL (ref 70–99)
Potassium: 4.1 mmol/L (ref 3.5–5.1)
Sodium: 139 mmol/L (ref 135–145)
Total Bilirubin: 0.5 mg/dL (ref 0.3–1.2)
Total Protein: 7 g/dL (ref 6.5–8.1)

## 2019-01-26 LAB — LACTATE DEHYDROGENASE: LDH: 250 U/L — ABNORMAL HIGH (ref 98–192)

## 2019-01-26 MED ORDER — SODIUM CHLORIDE 0.9% FLUSH
10.0000 mL | INTRAVENOUS | Status: DC | PRN
  Administered 2019-01-26: 10 mL via INTRAVENOUS
  Filled 2019-01-26: qty 10

## 2019-01-26 MED ORDER — HEPARIN SOD (PORK) LOCK FLUSH 100 UNIT/ML IV SOLN
500.0000 [IU] | Freq: Once | INTRAVENOUS | Status: AC
  Administered 2019-01-26: 500 [IU] via INTRAVENOUS
  Filled 2019-01-26: qty 5

## 2019-01-26 NOTE — Patient Instructions (Signed)
Implanted Port Insertion, Care After This sheet gives you information about how to care for yourself after your procedure. Your health care provider may also give you more specific instructions. If you have problems or questions, contact your health care provider. What can I expect after the procedure? After the procedure, it is common to have:  Discomfort at the port insertion site.  Bruising on the skin over the port. This should improve over 3-4 days. Follow these instructions at home: Port care  After your port is placed, you will get a manufacturer's information card. The card has information about your port. Keep this card with you at all times.  Take care of the port as told by your health care provider. Ask your health care provider if you or a family member can get training for taking care of the port at home. A home health care nurse may also take care of the port.  Make sure to remember what type of port you have. Incision care      Follow instructions from your health care provider about how to take care of your port insertion site. Make sure you: ? Wash your hands with soap and water before and after you change your bandage (dressing). If soap and water are not available, use hand sanitizer. ? Change your dressing as told by your health care provider. ? Leave stitches (sutures), skin glue, or adhesive strips in place. These skin closures may need to stay in place for 2 weeks or longer. If adhesive strip edges start to loosen and curl up, you may trim the loose edges. Do not remove adhesive strips completely unless your health care provider tells you to do that.  Check your port insertion site every day for signs of infection. Check for: ? Redness, swelling, or pain. ? Fluid or blood. ? Warmth. ? Pus or a bad smell. Activity  Return to your normal activities as told by your health care provider. Ask your health care provider what activities are safe for you.  Do not  lift anything that is heavier than 10 lb (4.5 kg), or the limit that you are told, until your health care provider says that it is safe. General instructions  Take over-the-counter and prescription medicines only as told by your health care provider.  Do not take baths, swim, or use a hot tub until your health care provider approves. Ask your health care provider if you may take showers. You may only be allowed to take sponge baths.  Do not drive for 24 hours if you were given a sedative during your procedure.  Wear a medical alert bracelet in case of an emergency. This will tell any health care providers that you have a port.  Keep all follow-up visits as told by your health care provider. This is important. Contact a health care provider if:  You cannot flush your port with saline as directed, or you cannot draw blood from the port.  You have a fever or chills.  You have redness, swelling, or pain around your port insertion site.  You have fluid or blood coming from your port insertion site.  Your port insertion site feels warm to the touch.  You have pus or a bad smell coming from the port insertion site. Get help right away if:  You have chest pain or shortness of breath.  You have bleeding from your port that you cannot control. Summary  Take care of the port as told by your health   care provider. Keep the manufacturer's information card with you at all times.  Change your dressing as told by your health care provider.  Contact a health care provider if you have a fever or chills or if you have redness, swelling, or pain around your port insertion site.  Keep all follow-up visits as told by your health care provider. This information is not intended to replace advice given to you by your health care provider. Make sure you discuss any questions you have with your health care provider. Document Released: 04/08/2013 Document Revised: 01/14/2018 Document Reviewed: 01/14/2018  Elsevier Patient Education  2020 [employer]  

## 2019-01-26 NOTE — Progress Notes (Signed)
Hematology and Oncology Follow Up Visit  Brittany Williamson [id number] 1946-04-10 73 y.o. 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  The big news with her is that she will be moving.  Apparently, a sister and brother bought her a house in [place].  This house is in [place], [place] which is on the [facility].  She thinks that she will be moving at the end of the year.  I am happy for her.  Sounds like Brittany Williamson is a very [employer].  [place] apparently [place] for [employer].  As far as I know, menhaden is mostly used for its oil and as a ingredient in pet food.  We did go ahead and get a PET scan on this date.  The PET scan from my reading it looked good.  I will not see anything that looked that was obviously active.  However, the radiologist said there was some areas of low level activity which are non-specific.  As such, we will have to repeat the PET scan about 3 months.  She is busy trying to help take care of her mother-in-law.  This might be an issue with her moving.  She has had no fever.  There has been no cough.  She has had no nausea or vomiting.  There is been no change in bowel or bladder habits.  Overall, her performance status is ECOG 1.  Medications:  Allergies as of 01/26/2019      Reactions   Bee Venom Other (See Comments)   Penicillins Rash   Beeswax Other (See Comments)   Bee stings. Bee stings. Bee stings. Bee stings.      Medication List       Accurate as of January 26, 2019 12:46 PM. If you have any questions, ask your nurse or doctor.        atorvastatin 20 MG tablet Commonly known as: LIPITOR Take 20 mg by mouth daily.    butalbital-aspirin-caffeine 50-325-40 MG capsule Commonly known as: Fiorinal Take 1 capsule by mouth every 6 (six) hours as needed for headache.   carvedilol 3.125 MG tablet Commonly known as: COREG Take 3.125 mg by mouth 2 (two) times daily.   Diethylpropion HCl CR 75 MG Tb24 Take 75 mg by mouth daily.   DULoxetine 30 MG capsule Commonly known as: CYMBALTA Take 30 mg by mouth daily.   levothyroxine 75 MCG tablet Commonly known as: SYNTHROID TAKE 1 TABLET(75 MCG) BY MOUTH DAILY BEFORE BREAKFAST   lidocaine-prilocaine cream Commonly known as: EMLA Apply 1 application topically as needed. Apply 1 hr prior to pac access and cover with plastic wrap   methylPREDNISolone 4 MG Tbpk tablet Commonly known as: MEDROL DOSEPAK Take as directed on package.   mupirocin cream 2 % Commonly known as: Bactroban Apply 1 application topically 3 (three) times daily.   nystatin cream Commonly known as: MYCOSTATIN Apply 1 application topically 2 (two) times daily.   valsartan-hydrochlorothiazide 80-12.5 MG tablet Commonly known as: DIOVAN-HCT       Allergies:  Allergies  Allergen Reactions  . Bee Venom Other (See Comments)  . Penicillins Rash  . Beeswax Other (See Comments)    Bee stings. Bee  stings.  Bee stings. Bee stings.    Past Medical History, Surgical history, Social history, and Family History were reviewed and updated.  Review of Systems:  Review of Systems  Constitutional: Negative.   HENT: Negative.   Eyes: Negative.   Respiratory: Negative.   Cardiovascular: Negative.   Gastrointestinal: Negative.   Genitourinary: Negative.   Musculoskeletal: Negative.   Skin: Positive for rash.  Neurological: Negative.   Endo/Heme/Allergies: Negative.   Psychiatric/Behavioral: Negative.      Physical Exam:  weight is 233 lb (105.7 kg). Her oral temperature is 97.5 F (36.4 C) (abnormal). Her blood pressure is 157/67 (abnormal) and her pulse is 83. Her respiration  is 20 and oxygen saturation is 99%.   Wt Readings from Last 3 Encounters:  01/26/19 233 lb (105.7 kg)  11/25/18 235 lb (106.6 kg)  10/27/18 238 lb 1.6 oz (108 kg)    Physical Exam Vitals signs reviewed.  HENT:     Head: Normocephalic and atraumatic.  Eyes:     Pupils: Pupils are equal, round, and reactive to light.  Neck:     Musculoskeletal: Normal range of motion.  Cardiovascular:     Rate and Rhythm: Normal rate and regular rhythm.     Heart sounds: Normal heart sounds.  Pulmonary:     Effort: Pulmonary effort is normal.     Breath sounds: Normal breath sounds.  Abdominal:     General: Bowel sounds are normal.     Palpations: Abdomen is soft.  Musculoskeletal: Normal range of motion.        General: No tenderness or deformity.  Lymphadenopathy:     Cervical: No cervical adenopathy.  Skin:    General: Skin is warm and dry.     Findings: No erythema or rash.     Comments: On her left lower leg, she does have this macular type rash.  Couple areas have central eschars.  There is some bleeding noted.  There is no exudate.  Neurological:     Mental Status: She is alert and oriented to person, place, and time.  Psychiatric:        Behavior: Behavior normal.        Thought Content: Thought content normal.        Judgment: Judgment normal.      Lab Results  Component Value Date   WBC 8.7 01/26/2019   HGB 13.5 01/26/2019   HCT 41.3 01/26/2019   MCV 86.9 01/26/2019   PLT 259 01/26/2019   Lab Results  Component Value Date   FERRITIN 263 11/25/2018   IRON 68 11/25/2018   TIBC 303 11/25/2018   UIBC 235 11/25/2018   IRONPCTSAT 22 11/25/2018   Lab Results  Component Value Date   RBC 4.75 01/26/2019   No results found for: KPAFRELGTCHN, LAMBDASER, KAPLAMBRATIO No results found for: IGGSERUM, IGA, IGMSERUM No results found for: Odetta Pink, SPEI   Chemistry      Component Value Date/Time   NA 139 01/26/2019  1213   K 4.1 01/26/2019 1213   CL 104 01/26/2019 1213   CO2 27 01/26/2019 1213   BUN 14 01/26/2019 1213   CREATININE 0.74 01/26/2019 1213      Component Value Date/Time   CALCIUM 8.5 (L) 01/26/2019 1213   ALKPHOS 84 01/26/2019 1213   AST 14 (L) 01/26/2019 1213   ALT 13 01/26/2019 1213   BILITOT 0.5 01/26/2019 1213       Impression and Plan: Ms.  Williamson is a very pleasant 73 yo caucasian female with stage IIIa (T2N1M0) nodular melanoma of the left shoulder.   I will see about the PET scan in 3 months.  She has a Port-A-Cath and so we will have to make sure that this is flush.  It sounds like she is already getting a doctor up in [place].  Apparently, her sister is busy taking care of all of this.  We will miss Brittany Williamson when she moves.  She is very nice.  She really has a good sense of humor.  She is really done quite well.  We will see her back before she leaves.  I want to make sure that we get everything arranged for her from our point of view.    Volanda Napoleon, MD 7/27/202012:46 PM

## 2019-02-09 ENCOUNTER — Other Ambulatory Visit: Payer: Self-pay | Admitting: Family

## 2019-02-09 DIAGNOSIS — C4362 Malignant melanoma of left upper limb, including shoulder: Secondary | ICD-10-CM

## 2019-02-09 DIAGNOSIS — E032 Hypothyroidism due to medicaments and other exogenous substances: Secondary | ICD-10-CM

## 2019-02-18 ENCOUNTER — Other Ambulatory Visit: Payer: Self-pay | Admitting: Family

## 2019-02-18 DIAGNOSIS — C4362 Malignant melanoma of left upper limb, including shoulder: Secondary | ICD-10-CM

## 2019-02-18 DIAGNOSIS — E032 Hypothyroidism due to medicaments and other exogenous substances: Secondary | ICD-10-CM

## 2019-02-28 IMAGING — XA IR FLUORO GUIDE CV LINE*L*
1 series · 1 of 1 positions shown · non-contrast
Comparison: none

CLINICAL DATA: Malignant melanoma and need for porta cath for
continued immunotherapy treatment.

[Series 300: line placements · 1 of 1 slices shown]
[im 1/1]
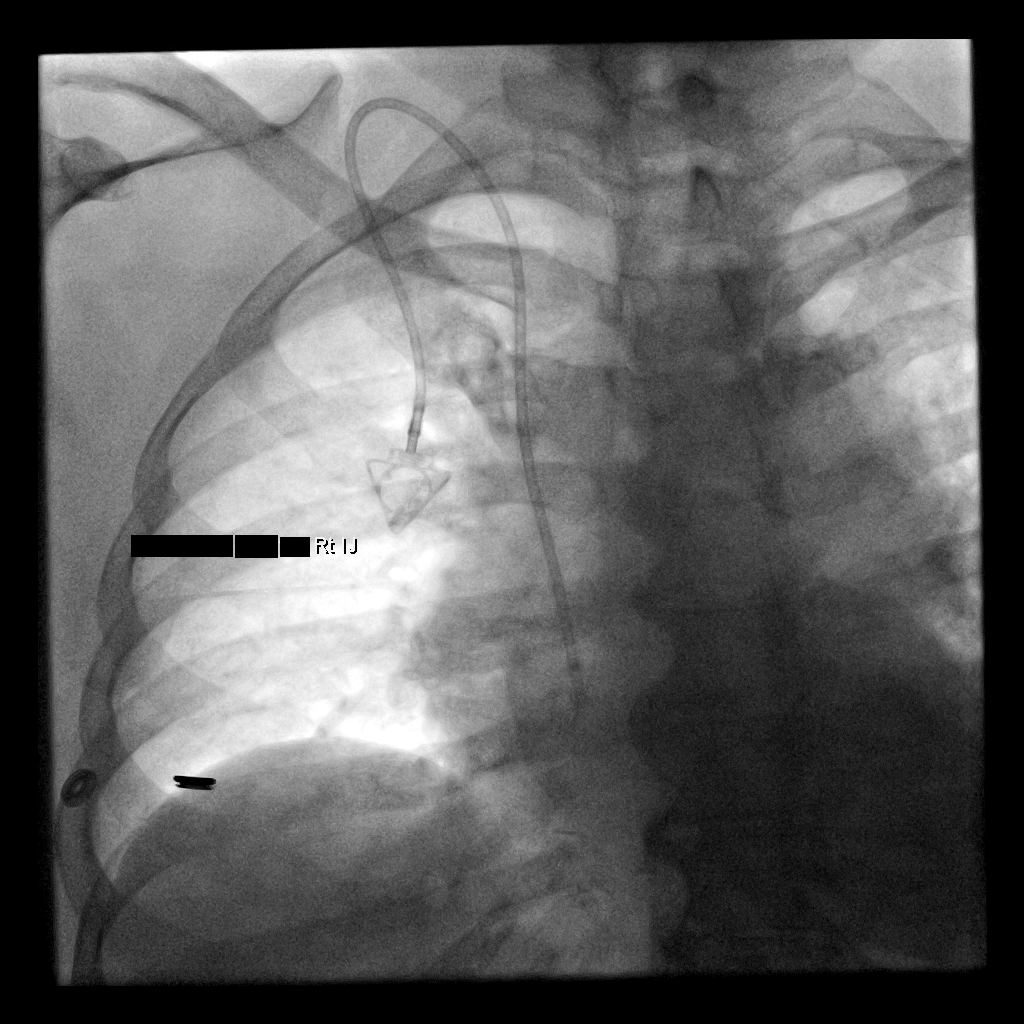

[1 of 1 positions shown; findings below may reference images not displayed]

EXAM:
IMPLANTED PORT A CATH PLACEMENT WITH ULTRASOUND AND FLUOROSCOPIC
GUIDANCE

ANESTHESIA/SEDATION:
2.0 mg IV Versed; 100 mcg IV Fentanyl

Total Moderate Sedation Time:  30 minutes

The patient's level of consciousness and physiologic status were
continuously monitored during the procedure by Radiology nursing.

Additional Medications: 1.5 g IV vancomycin. Vancomycin was given
within two hours of incision. Vancomycin was given due to an
antibiotic allergy.

FLUOROSCOPY TIME:  30 seconds.  5.4 mGy.

PROCEDURE:
The procedure, risks, benefits, and alternatives were explained to
the patient. Questions regarding the procedure were encouraged and
answered. The patient understands and consents to the procedure. A
time-out was performed prior to initiating the procedure.

Ultrasound was utilized to confirm patency of the right internal
jugular vein. The right neck and chest were prepped with
chlorhexidine in a sterile fashion, and a sterile drape was applied
covering the operative field. Maximum barrier sterile technique with
sterile gowns and gloves were used for the procedure. Local
anesthesia was provided with 1% lidocaine.

After creating a small venotomy incision, a 21 gauge needle was
advanced into the right internal jugular vein under direct,
real-time ultrasound guidance. Ultrasound image documentation was
performed. After securing guidewire access, an 8 Fr dilator was
placed. A J-wire was kinked to measure appropriate catheter length.

A subcutaneous port pocket was then created along the upper chest
wall utilizing sharp and blunt dissection. Portable cautery was
utilized. The pocket was irrigated with sterile saline.

A single lumen power injectable port was chosen for placement. The 8
Fr catheter was tunneled from the port pocket site to the venotomy
incision. The port was placed in the pocket. External catheter was
trimmed to appropriate length based on guidewire measurement.

At the venotomy, an 8 Fr peel-away sheath was placed over a
guidewire. The catheter was then placed through the sheath and the
sheath removed. Final catheter positioning was confirmed and
documented with a fluoroscopic spot image. The port was accessed
with a needle and aspirated and flushed with heparinized saline. The
access needle was removed.

The venotomy and port pocket incisions were closed with subcutaneous
3-0 Monocryl and subcuticular 4-0 Vicryl. Dermabond was applied to
both incisions.

COMPLICATIONS:
COMPLICATIONS
None
FINDINGS: After catheter placement, the tip lies at the Trigo junction.
The catheter aspirates normally and is ready for immediate use.
IMPRESSION: Placement of single lumen port a cath via right internal jugular
vein. The catheter tip lies at the Trigo junction. A power
injectable port a cath was placed and is ready for immediate use.

## 2019-03-16 ENCOUNTER — Inpatient Hospital Stay: Payer: Medicare HMO | Attending: Hematology & Oncology

## 2019-03-16 ENCOUNTER — Other Ambulatory Visit: Payer: Self-pay

## 2019-03-16 DIAGNOSIS — C4362 Malignant melanoma of left upper limb, including shoulder: Secondary | ICD-10-CM | POA: Diagnosis present

## 2019-03-16 DIAGNOSIS — Z452 Encounter for adjustment and management of vascular access device: Secondary | ICD-10-CM | POA: Diagnosis present

## 2019-03-16 MED ORDER — SODIUM CHLORIDE 0.9% FLUSH
10.0000 mL | Freq: Once | INTRAVENOUS | Status: AC
  Administered 2019-03-16: 10 mL
  Filled 2019-03-16: qty 10

## 2019-03-16 MED ORDER — HEPARIN SOD (PORK) LOCK FLUSH 100 UNIT/ML IV SOLN
500.0000 [IU] | Freq: Once | INTRAVENOUS | Status: AC
  Administered 2019-03-16: 08:00:00 500 [IU] via INTRAVENOUS
  Filled 2019-03-16: qty 5

## 2019-03-16 NOTE — Patient Instructions (Signed)

## 2019-04-14 ENCOUNTER — Other Ambulatory Visit: Payer: Self-pay | Admitting: Family

## 2019-04-15 ENCOUNTER — Other Ambulatory Visit: Payer: Self-pay | Admitting: Family

## 2019-04-16 ENCOUNTER — Encounter (HOSPITAL_COMMUNITY): Payer: Medicare HMO

## 2019-04-23 ENCOUNTER — Encounter (HOSPITAL_COMMUNITY)
Admission: RE | Admit: 2019-04-23 | Discharge: 2019-04-23 | Disposition: A | Discharge status: Home / Self Care | Payer: Medicare HMO | Source: Ambulatory Visit | Attending: Hematology & Oncology | Admitting: Hematology & Oncology

## 2019-04-23 ENCOUNTER — Other Ambulatory Visit: Payer: Self-pay

## 2019-04-23 DIAGNOSIS — Z79899 Other long term (current) drug therapy: Secondary | ICD-10-CM | POA: Insufficient documentation

## 2019-04-23 DIAGNOSIS — R9082 White matter disease, unspecified: Secondary | ICD-10-CM | POA: Insufficient documentation

## 2019-04-23 DIAGNOSIS — M170 Bilateral primary osteoarthritis of knee: Secondary | ICD-10-CM | POA: Diagnosis not present

## 2019-04-23 DIAGNOSIS — C4362 Malignant melanoma of left upper limb, including shoulder: Secondary | ICD-10-CM | POA: Diagnosis present

## 2019-04-23 LAB — GLUCOSE, CAPILLARY: Glucose-Capillary: 111 mg/dL — ABNORMAL HIGH (ref 70–99)

## 2019-04-23 MED ORDER — FLUDEOXYGLUCOSE F - 18 (FDG) INJECTION
11.9300 | Freq: Once | INTRAVENOUS | Status: AC | PRN
  Administered 2019-04-23: 11.93 via INTRAVENOUS

## 2019-04-27 ENCOUNTER — Inpatient Hospital Stay: Payer: Medicare HMO

## 2019-04-27 ENCOUNTER — Inpatient Hospital Stay: Payer: Medicare HMO | Admitting: Hematology & Oncology

## 2019-05-01 ENCOUNTER — Other Ambulatory Visit: Payer: Self-pay | Admitting: Hematology & Oncology

## 2019-05-01 DIAGNOSIS — C4362 Malignant melanoma of left upper limb, including shoulder: Secondary | ICD-10-CM

## 2019-05-04 ENCOUNTER — Encounter (HOSPITAL_COMMUNITY): Payer: Self-pay | Admitting: Radiology

## 2019-05-04 NOTE — Progress Notes (Unsigned)
Brittany Williamson Female, 73 y.o., 16-May-1946 MRN:  [id number] Phone:  [phone] (H) PCP:  Derrill Center., MD Coverage:  [facility] Medicare/[employer] Medicare Hmo Next Appt With Radiology ([facility]-US 2) 05/07/2019 at 1:00 PM  RE: [place] Core Biopsy (lymph nodes) Received: Yesterday Message Contents  Arne Cleveland, MD  Garth Bigness D        OK   [place] core R supraclav LAN   See below   DDH   Previous Messages  ----- Message -----  From: Volanda Napoleon, MD  Sent: 05/01/2019  5:28 PM EST  To: Arne Cleveland, MD  Subject: RE: [place] Core Biopsy (lymph nodes)         Dan: please give it a try!!! I know you guys do a great job!! I will be praying for you!! [name]  ----- Message -----  From: Arne Cleveland, MD  Sent: 05/01/2019  4:08 PM EDT  To: Wyonia Hough, MD  Subject: RE: [place] Core Biopsy (lymph nodes)         Pete:   These R supraclav LN's are just 5-27mm. We may not be able to get much tissue on needle bx, but I'm happy to set her up for a try.  The other option would be surgical LN resection and get the whole node.  There are no better sites for percutaneous biopsy.  Let me know how you'd like to proceed.  Thanks  Arne Cleveland   ----- Message -----  From: Garth Bigness D  Sent: 05/01/2019  3:45 PM EDT  To: Ir Procedure Requests  Subject: [place] Core Biopsy (lymph nodes)           Procedure:  [place] Core Biopsy (lymph nodes)   Reason: Malignant melanoma of arm, left , ++PET scan for recurrent melanoma. need tissue for molecular studies -- RIGHT supraclavicular area might be best site.   History: NM PET in computer   Dr. Marin Olp, [place]  [phone]

## 2019-05-05 ENCOUNTER — Other Ambulatory Visit: Payer: Self-pay | Admitting: Radiology

## 2019-05-07 ENCOUNTER — Ambulatory Visit (HOSPITAL_COMMUNITY)
Admission: RE | Admit: 2019-05-07 | Discharge: 2019-05-07 | Disposition: A | Discharge status: Home / Self Care | Payer: Medicare HMO | Source: Ambulatory Visit | Attending: Family Medicine | Admitting: Family Medicine

## 2019-05-07 ENCOUNTER — Ambulatory Visit (HOSPITAL_COMMUNITY)
Admission: RE | Admit: 2019-05-07 | Discharge: 2019-05-07 | Disposition: A | Discharge status: Home / Self Care | Payer: Medicare HMO | Source: Ambulatory Visit | Attending: Hematology & Oncology | Admitting: Hematology & Oncology

## 2019-05-07 ENCOUNTER — Encounter (HOSPITAL_COMMUNITY): Payer: Self-pay

## 2019-05-07 ENCOUNTER — Other Ambulatory Visit: Payer: Self-pay

## 2019-05-07 DIAGNOSIS — C770 Secondary and unspecified malignant neoplasm of lymph nodes of head, face and neck: Secondary | ICD-10-CM | POA: Insufficient documentation

## 2019-05-07 DIAGNOSIS — Z85820 Personal history of malignant melanoma of skin: Secondary | ICD-10-CM | POA: Diagnosis not present

## 2019-05-07 DIAGNOSIS — Z79890 Hormone replacement therapy (postmenopausal): Secondary | ICD-10-CM | POA: Insufficient documentation

## 2019-05-07 DIAGNOSIS — Z79899 Other long term (current) drug therapy: Secondary | ICD-10-CM | POA: Diagnosis not present

## 2019-05-07 DIAGNOSIS — R590 Localized enlarged lymph nodes: Secondary | ICD-10-CM | POA: Diagnosis present

## 2019-05-07 DIAGNOSIS — F329 Major depressive disorder, single episode, unspecified: Secondary | ICD-10-CM | POA: Insufficient documentation

## 2019-05-07 DIAGNOSIS — Z91030 Bee allergy status: Secondary | ICD-10-CM | POA: Diagnosis not present

## 2019-05-07 DIAGNOSIS — Z880 Allergy status to penicillin: Secondary | ICD-10-CM | POA: Insufficient documentation

## 2019-05-07 DIAGNOSIS — C4362 Malignant melanoma of left upper limb, including shoulder: Secondary | ICD-10-CM

## 2019-05-07 DIAGNOSIS — I10 Essential (primary) hypertension: Secondary | ICD-10-CM | POA: Insufficient documentation

## 2019-05-07 HISTORY — DX: Essential (primary) hypertension: I10

## 2019-05-07 HISTORY — DX: Depression, unspecified: F32.A

## 2019-05-07 LAB — CBC WITH DIFFERENTIAL/PLATELET
Abs Immature Granulocytes: 0.03 10*3/uL (ref 0.00–0.07)
Basophils Absolute: 0 10*3/uL (ref 0.0–0.1)
Basophils Relative: 0 %
Eosinophils Absolute: 0.2 10*3/uL (ref 0.0–0.5)
Eosinophils Relative: 2 %
HCT: 41.6 % (ref 36.0–46.0)
Hemoglobin: 13.4 g/dL (ref 12.0–15.0)
Immature Granulocytes: 0 %
Lymphocytes Relative: 35 %
Lymphs Abs: 3 10*3/uL (ref 0.7–4.0)
MCH: 28.3 pg (ref 26.0–34.0)
MCHC: 32.2 g/dL (ref 30.0–36.0)
MCV: 87.9 fL (ref 80.0–100.0)
Monocytes Absolute: 0.6 10*3/uL (ref 0.1–1.0)
Monocytes Relative: 6 %
Neutro Abs: 4.8 10*3/uL (ref 1.7–7.7)
Neutrophils Relative %: 57 %
Platelets: 260 10*3/uL (ref 150–400)
RBC: 4.73 MIL/uL (ref 3.87–5.11)
RDW: 14.5 % (ref 11.5–15.5)
WBC: 8.5 10*3/uL (ref 4.0–10.5)
nRBC: 0 % (ref 0.0–0.2)

## 2019-05-07 LAB — BASIC METABOLIC PANEL
Anion gap: 9 (ref 5–15)
BUN: 19 mg/dL (ref 8–23)
CO2: 23 mmol/L (ref 22–32)
Calcium: 8.7 mg/dL — ABNORMAL LOW (ref 8.9–10.3)
Chloride: 108 mmol/L (ref 98–111)
Creatinine, Ser: 0.77 mg/dL (ref 0.44–1.00)
GFR calc Af Amer: >60 mL/min (ref >60)
GFR calc non Af Amer: >60 mL/min (ref >60)
Glucose, Bld: 105 mg/dL — ABNORMAL HIGH (ref 70–99)
Potassium: 4 mmol/L (ref 3.5–5.1)
Sodium: 140 mmol/L (ref 135–145)

## 2019-05-07 LAB — PROTIME-INR
INR: 0.9 (ref 0.8–1.2)
Prothrombin Time: 12.4 seconds (ref 11.4–15.2)

## 2019-05-07 MED ORDER — LIDOCAINE HCL 1 % IJ SOLN
INTRAMUSCULAR | Status: AC
  Filled 2019-05-07: qty 10

## 2019-05-07 MED ORDER — SODIUM CHLORIDE 0.9 % IV SOLN
INTRAVENOUS | Status: DC
  Administered 2019-05-07: 12:00:00 via INTRAVENOUS

## 2019-05-07 MED ORDER — LIDOCAINE HCL (PF) 1 % IJ SOLN
INTRAMUSCULAR | Status: AC | PRN
  Administered 2019-05-07: 8 mL

## 2019-05-07 MED ORDER — MIDAZOLAM HCL 2 MG/2ML IJ SOLN
INTRAMUSCULAR | Status: AC
  Filled 2019-05-07: qty 2

## 2019-05-07 MED ORDER — FENTANYL CITRATE (PF) 100 MCG/2ML IJ SOLN
INTRAMUSCULAR | Status: AC | PRN
  Administered 2019-05-07: 50 ug via INTRAVENOUS

## 2019-05-07 MED ORDER — MIDAZOLAM HCL 2 MG/2ML IJ SOLN
INTRAMUSCULAR | Status: AC | PRN
  Administered 2019-05-07 (×2): 1 mg via INTRAVENOUS

## 2019-05-07 MED ORDER — FENTANYL CITRATE (PF) 100 MCG/2ML IJ SOLN
INTRAMUSCULAR | Status: AC
  Filled 2019-05-07: qty 2

## 2019-05-07 NOTE — Discharge Instructions (Addendum)
Needle Biopsy, Care After °These instructions tell you how to care for yourself after your procedure. Your doctor may also give you more specific instructions. Call your doctor if you have any problems or questions. °What can I expect after the procedure? °After the procedure, it is common to have: °· Soreness. °· Bruising. °· Mild pain. °Follow these instructions at home: ° °· Return to your normal activities as told by your doctor. Ask your doctor what activities are safe for you. °· Take over-the-counter and prescription medicines only as told by your doctor. °· Wash your hands with soap and water before you change your bandage (dressing). If you cannot use soap and water, use hand sanitizer. °· Follow instructions from your doctor about: °? How to take care of your puncture site. °? When and how to change your bandage. °? When to remove your bandage. °· Check your puncture site every day for signs of infection. Watch for: °? Redness, swelling, or pain. °? Fluid or blood.  °? Pus or a bad smell. °? Warmth. °· Do not take baths, swim, or use a hot tub until your doctor approves. Ask your doctor if you may take showers. You may only be allowed to take sponge baths. °· Keep all follow-up visits as told by your doctor. This is important. °Contact a doctor if you have: °· A fever. °· Redness, swelling, or pain at the puncture site, and it lasts longer than a few days. °· Fluid, blood, or pus coming from the puncture site. °· Warmth coming from the puncture site. °Get help right away if: °· You have a lot of bleeding from the puncture site. °Summary °· After the procedure, it is common to have soreness, bruising, or mild pain at the puncture site. °· Check your puncture site every day for signs of infection, such as redness, swelling, or pain. °· Get help right away if you have severe bleeding from your puncture site. °This information is not intended to replace advice given to you by your health care provider. Make  sure you discuss any questions you have with your health care provider. °Document Released: 05/31/2008 Document Revised: 07/01/2017 Document Reviewed: 07/01/2017 °Elsevier Patient Education © 2020 Elsevier Inc[employer] °Moderate Conscious Sedation, Adult, Care After °These instructions provide you with information about caring for yourself after your procedure. Your health care provider may also give you more specific instructions. Your treatment has been planned according to current medical practices, but problems sometimes occur. Call your health care provider if you have any problems or questions after your procedure. °What can I expect after the procedure? °After your procedure, it is common: °· To feel sleepy for several hours. °· To feel clumsy and have poor balance for several hours. °· To have poor judgment for several hours. °· To vomit if you eat too soon. °Follow these instructions at home: °For at least 24 hours after the procedure: ° °· Do not: °? Participate in activities where you could fall or become injured. °? Drive. °? Use heavy machinery. °? Drink alcohol. °? Take sleeping pills or medicines that cause drowsiness. °? Make important decisions or sign legal documents. °? Take care of children on your own. °· Rest. °Eating and drinking °· Follow the diet recommended by your health care provider. °· If you vomit: °? Drink water, juice, or soup when you can drink without vomiting. °? Make sure you have little or no nausea before eating solid foods. °General instructions °· Have a responsible adult stay with   you until you are awake and alert. °· Take over-the-counter and prescription medicines only as told by your health care provider. °· If you smoke, do not smoke without supervision. °· Keep all follow-up visits as told by your health care provider. This is important. °Contact a health care provider if: °· You keep feeling nauseous or you keep vomiting. °· You feel light-headed. °· You develop a rash. °· You  have a fever. °Get help right away if: °· You have trouble breathing. °This information is not intended to replace advice given to you by your health care provider. Make sure you discuss any questions you have with your health care provider. °Document Released: 04/08/2013 Document Revised: 05/31/2017 Document Reviewed: 10/08/2015 °Elsevier Patient Education © 2020 Elsevier Inc. ° °

## 2019-05-07 NOTE — H&P (Signed)
Chief Complaint: Patient was seen in consultation today for right supraclavicular lymphadenopathy/biopsy.  Referring Physician(s): Ennever,Peter R  Supervising Physician: Aletta Edouard  Patient Status: [facility] - Out-pt  History of Present Illness: Brittany Williamson is a 73 y.o. female with a past medical history of hypertension, malignant melanoma of left arm, and depression. She was unfortunately diagnosed with malignant melanoma of the left arm in 10/2018. Her cancer is managed by Dr. Marin Olp. She had a wide local excision and left axillary node excision x2 (one of these sentinel lymph nodes were positive) 11/05/2017 by Dr. Mia Creek. She has completed one course of immunotherapy (final dose 11/25/2018). Her most recent follow-up PET 04/23/2019 revealed highly metabolic lymph nodes in the right lower neck and chest, concerning for malignancy.  PET 04/23/2019: 1. Small but highly hypermetabolic lymph nodes in the right lower neck and chest compatible with active malignancy. Low-grade activity in bilateral inguinal lymph nodes, indeterminate. 2. 1 cm focus of accentuated activity in the mid transverse colon, not visible previously, probably physiologic, surveillance suggested. 3. Other imaging findings of potential clinical significance: Aortic Atherosclerosis (ICD10-I70.0). 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 Left adnexal cyst without abnormal activity. Bilateral knee osteoarthritis.  IR requested by Dr. Marin Olp for possible image-guided right supraclavicular lymph node biopsy for tissue diagnosis. Patient awake and alert laying in bed with no complaints at this time. Denies fever, chills, chest pain, dyspnea, abdominal pain, or headache.   Past Medical History:  Diagnosis Date   Depression    Goals of care, counseling/discussion 12/02/2017   Hypertension    Malignant melanoma of arm, left ([place]) 12/02/2017    Past Surgical History:  Procedure Laterality Date    IR IMAGING GUIDED PORT INSERTION  03/11/2018    Allergies: Bee venom, Penicillins, and Beeswax  Medications: Prior to Admission medications   Medication Sig Start Date End Date Taking? Authorizing Provider  atorvastatin (LIPITOR) 20 MG tablet Take 20 mg by mouth daily. 01/07/14  Yes [provider]  butalbital-aspirin-caffeine Acquanetta Chain) 50-325-40 MG capsule Take 1 capsule by mouth every 6 (six) hours as needed for headache. 03/17/18  Yes Ennever, Rudell Cobb, MD  carvedilol (COREG) 3.125 MG tablet Take 3.125 mg by mouth 2 (two) times daily. 01/26/14  Yes [provider]  Diethylpropion HCl CR 75 MG TB24 Take 75 mg by mouth daily. 03/19/17  Yes [provider]  DULoxetine (CYMBALTA) 30 MG capsule Take 30 mg by mouth daily. 11/04/17  Yes [provider]  levothyroxine (SYNTHROID) 75 MCG tablet TAKE 1 TABLET(75 MCG) BY MOUTH DAILY BEFORE BREAKFAST 02/18/19  Yes [place], Holli Humbles, NP  lidocaine-prilocaine (EMLA) cream Apply 1 application topically as needed. Apply 1 hr prior to pac access and cover with plastic wrap 03/17/18  Yes Ennever, Rudell Cobb, MD  methylPREDNISolone (MEDROL DOSEPAK) 4 MG TBPK tablet Take as directed on package. 08/11/18  Yes [place], Holli Humbles, NP  mupirocin cream (BACTROBAN) 2 % Apply 1 application topically 3 (three) times daily. 12/09/18  Yes Ennever, Rudell Cobb, MD  nystatin cream (MYCOSTATIN) Apply 1 application topically 2 (two) times daily. 01/21/18  Yes [place], Holli Humbles, NP  valsartan-hydrochlorothiazide (DIOVAN-HCT) 80-12.5 MG tablet  01/19/14  Yes [provider]     History reviewed. No pertinent family history.  Social History   Socioeconomic History   Marital status: Single    Spouse name: Not on file   Number of children: Not on file   Years of education: Not on file   Highest education level:  Not on file  Occupational History   Not on file  Social Needs   Financial resource strain: Not on file   Food insecurity      Worry: Not on file    Inability: Not on file   Transportation needs    Medical: Not on file    Non-medical: Not on file  Tobacco Use   Smoking status: Never Smoker   Smokeless tobacco: Never Used  Substance and Sexual Activity   Alcohol use: Not Currently   Drug use: Not Currently   Sexual activity: Not on file  Lifestyle   Physical activity    Days per week: Not on file    Minutes per session: Not on file   Stress: Not on file  Relationships   Social connections    Talks on phone: Not on file    Gets together: Not on file    Attends religious service: Not on file    Active member of club or organization: Not on file    Attends meetings of clubs or organizations: Not on file    Relationship status: Not on file  Other Topics Concern   Not on file  Social History Narrative   Not on file     Review of Systems: A 12 point ROS discussed and pertinent positives are indicated in the HPI above.  All other systems are negative.  Review of Systems  Constitutional: Negative for chills and fever.  Respiratory: Negative for shortness of breath and wheezing.   Cardiovascular: Negative for chest pain and palpitations.  Gastrointestinal: Negative for abdominal pain.  Neurological: Negative for headaches.  Psychiatric/Behavioral: Negative for behavioral problems and confusion.    Vital Signs: BP (!) 145/70    Pulse 66    Temp 97.9 F (36.6 C) (Oral)    Resp 18    SpO2 97%   Physical Exam Vitals signs and nursing note reviewed.  Constitutional:      General: She is not in acute distress.    Appearance: Normal appearance.  Cardiovascular:     Rate and Rhythm: Normal rate and regular rhythm.     Heart sounds: Normal heart sounds. No murmur.  Pulmonary:     Effort: Pulmonary effort is normal. No respiratory distress.     Breath sounds: Normal breath sounds. No wheezing.  Skin:    General: Skin is warm and dry.  Neurological:     Mental Status: She is alert and  oriented to person, place, and time.  Psychiatric:        Mood and Affect: Mood normal.        Behavior: Behavior normal.        Thought Content: Thought content normal.        Judgment: Judgment normal.      MD Evaluation Airway: WNL Heart: WNL Abdomen: WNL Chest/ Lungs: WNL ASA  Classification: 3 Mallampati/Airway Score: Two   Imaging: Nm Pet Image Restage (ps) Whole Body  Result Date: 04/23/2019 CLINICAL DATA:  Subsequent treatment strategy for melanoma. EXAM: NUCLEAR MEDICINE PET WHOLE BODY TECHNIQUE: 11.9 mCi F-18 FDG was injected intravenously. Full-ring PET imaging was performed from the skull vertex through the feet after the radiotracer. CT data was obtained and used for attenuation correction and anatomic localization. Fasting blood glucose: 111 mg/dl COMPARISON:  Multiple exams, including 11/05/2018 FINDINGS: Mediastinal blood pool activity: SUV max 3.2 HEAD/NECK: Newly hypermetabolic right level IV lymph node 0.6 cm in short axis on image 69/4, maximum SUV 34.9. Adjacent 0.5  cm right level IV lymph node on image 71/4 has maximum SUV of 26.5. 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 Other bilateral internal jugular lymph nodes are not appreciably hypermetabolic. Glottic activity is likely physiologic. CHEST: Hypermetabolic right supraclavicular, right upper paratracheal, and subcarinal lymph nodes. Clustered subcarinal nodes with the larger measuring 1.2 cm in short axis, maximum SUV 67.9. A right upper paratracheal node measuring 0.7 cm in short axis on image 81/4 has maximum SUV of 34.5, previously 0.5 cm with maximum SUV 2.8. Incidental CT findings: Right Port-A-Cath tip: Right atrium. Atherosclerotic aortic arch and branch vessels. ABDOMEN/PELVIS: A 1 cm focus of accentuated activity in the mid transverse colon has maximum SUV of 8.8, and is not immediately apparent on the prior exam, this could be physiologic given the background  bowel activity. There is similar foci of activity in the descending and sigmoid colon without definite correlate on the CT data. Low-grade bilateral inguinal nodal activity is again identified, right inguinal lymph node 0.9 cm in short axis on image 209/4 (formerly 1.0 cm) with maximum SUV 4.2 (previously 4.8) and index left inguinal lymph node measuring 0.9 cm in short axis on image 211/4 (formerly 1.0 cm) with maximum SUV 3.3 (formerly 4.5). Incidental CT findings: Aortoiliac atherosclerotic vascular disease. Left adnexal cyst, photopenic. SKELETON: Accentuated activity centrally in the right knee joint along what may be a free osteochondral fragment, maximum SUV 10.8, formerly 5.7 this intra-articular location would not be characteristic of metastatic disease. Incidental CT findings: Bilateral knee osteoarthritis. EXTREMITIES: No significant abnormal hypermetabolic activity in this region. Incidental CT findings: none IMPRESSION: 1. Small but highly hypermetabolic lymph nodes in the right lower neck and chest compatible with active malignancy. Low-grade activity in bilateral inguinal lymph nodes, indeterminate. 2. 1 cm focus of accentuated activity in the mid transverse colon, not visible previously, probably physiologic, surveillance suggested. 3. Other imaging findings of potential clinical significance: Aortic Atherosclerosis (ICD10-I70.0). 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 Left adnexal cyst without abnormal activity. Bilateral knee osteoarthritis. Electronically Signed   By: Van Clines M.D.   On: 04/23/2019 14:00    Labs:  CBC: Recent Labs    10/27/18 0942 11/25/18 1133 01/26/19 1145 05/07/19 1152  WBC 8.6 9.1 8.7 8.5  HGB 13.4 13.8 13.5 13.4  HCT 41.5 42.9 41.3 41.6  PLT 307 271 259 260    COAGS: Recent Labs    05/07/19 1152  INR 0.9    BMP: Recent Labs    10/27/18 0942 11/25/18 1133 01/26/19 1213 05/07/19 1152  NA 139 139 139 140  K  3.8 4.0 4.1 4.0  CL 104 105 104 108  CO2 23 25 27 23   GLUCOSE 133* 116* 99 105*  BUN 19 18 14 19   CALCIUM 9.8 9.2 8.5* 8.7*  CREATININE 0.86 0.85 0.74 0.77  GFRNONAA >60 >60 >60 >60  GFRAA >60 >60 >60 >60    LIVER FUNCTION TESTS: Recent Labs    09/29/18 0945 10/27/18 0942 11/25/18 1133 01/26/19 1213  BILITOT 0.6 0.5 0.5 0.5  AST 15 14* 15 14*  ALT 15 14 16 13   ALKPHOS 97 103 109 84  PROT 7.6 7.6 7.0 7.0  ALBUMIN 4.5 4.3 4.5 4.2     Assessment and Plan:  Right supraclavicular lymphadenopathy. Plan for image-guided right supraclavicular lymph node biopsy today in IR. Patient is NPO. Afebrile and WBCs WNL. She does not take blood thinners. INR 0.9 today.  Risks and benefits discussed with the patient including,  but not limited to bleeding, infection, damage to adjacent structures or low yield requiring additional tests. All of the patient's questions were answered, patient is agreeable to proceed. Consent signed and in chart.   Thank you for this interesting consult.  I greatly enjoyed meeting Brittany Williamson and look forward to participating in their care.  A copy of this report was sent to the requesting provider on this date.  Electronically Signed: Earley Abide, PA-C 05/07/2019, 12:22 PM   I spent a total of 40 Minutes in face to face in clinical consultation, greater than 50% of which was counseling/coordinating care for right supraclavicular lymphadenopathy/biopsy.

## 2019-05-07 NOTE — Procedures (Signed)
Interventional Radiology Procedure Note  Procedure: US Guided Biopsy of right supraclavicular lymph node  Complications: None  Estimated Blood Loss: < 10 mL  Findings: 18 G core biopsy of right supraclavicular lymph node performed under US guidance.  Three core samples obtained and sent to Pathology.  Zalman Hull T. Trinidi Toppins, M.D Pager:  [phone]   

## 2019-05-12 ENCOUNTER — Encounter: Payer: Self-pay | Admitting: *Deleted

## 2019-05-12 LAB — SURGICAL PATHOLOGY

## 2019-05-12 NOTE — Progress Notes (Signed)
[facility] sent on right lymph node per order of Dr. Marin Olp.

## 2019-05-15 ENCOUNTER — Telehealth: Payer: Self-pay | Admitting: *Deleted

## 2019-05-15 NOTE — Telephone Encounter (Signed)
Message received from patient wanting to know what's next as far as treatment for her.  Call placed back to patient and patient, requests that this RN speak with her brother-in-law who is a physician who is at her house now.  Pt's brother in law states that they are in the process of moving pt to [place] with them and the movers are at the house now.  He also states that pt will be treated in [place] and that they are in the process of finding an Materials engineer for patient in [place].  Instructed pt.'s brother in law to contact this office once an Oncologist in [place] has been obtained for patient and that records could be faxed at pt.'s request.  Pt.'s brother in law appreciative of assistance and has no further questions at this time.

## 2019-05-19 ENCOUNTER — Inpatient Hospital Stay: Payer: Medicare HMO | Admitting: Hematology & Oncology

## 2019-05-19 ENCOUNTER — Inpatient Hospital Stay: Payer: Medicare HMO

## 2019-06-08 ENCOUNTER — Telehealth: Payer: Self-pay | Admitting: Hematology & Oncology

## 2019-06-08 NOTE — Telephone Encounter (Signed)
Pt moved and called to have medical records sent to: [facility]. Dr. Henriette Combs Verlee Monte, MD [facility] [address] [place], [place], [address]   [phone] Fax: [phone]

## 2019-07-15 ENCOUNTER — Telehealth: Payer: Self-pay | Admitting: *Deleted

## 2019-07-15 NOTE — Telephone Encounter (Signed)
Faxed [facility] results to [phone] in . Called the office and told them I was faxing it. Brittany Williamson verbalized understanding.

## 2019-07-16 ENCOUNTER — Other Ambulatory Visit: Payer: Self-pay | Admitting: Hematology & Oncology

## 2019-10-31 DEATH — deceased

## 2020-04-11 IMAGING — PT NM PET IMAGE RESTAGE (PS) WHOLE BODY
7 series · 25 of 25 positions shown · non-contrast
Comparison: Multiple exams, including 11/05/2018

CLINICAL DATA: Subsequent treatment strategy for melanoma.

EXAM:
NUCLEAR MEDICINE PET WHOLE BODY
TECHNIQUE: 11.9 mCi F-18 FDG was injected intravenously. Full-ring PET imaging
was performed from the skull vertex through the feet after the
radiotracer. CT data was obtained and used for attenuation
correction and anatomic localization.
Fasting blood glucose: 111 mg/dl

[Series 3: pet wb ac · axial · 5.0mm · 4.07mm/px · z∈[-324,+1440]mm · 5 of 442 slices shown]
[im 1/442]
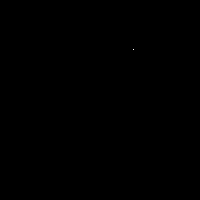
[im 111/442]
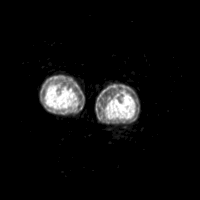
[im 221/442]
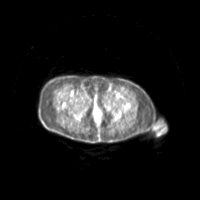
[im 331/442]
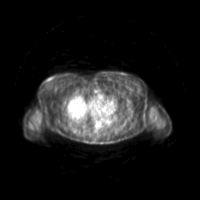
[im 442/442]
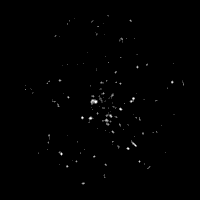

[Series 4: ct wb 5.0 hd_fov · axial · 5.0mm · 1.21mm/px · z∈[-324,+1440]mm · 6 of 437 slices shown]
[im 1/437]
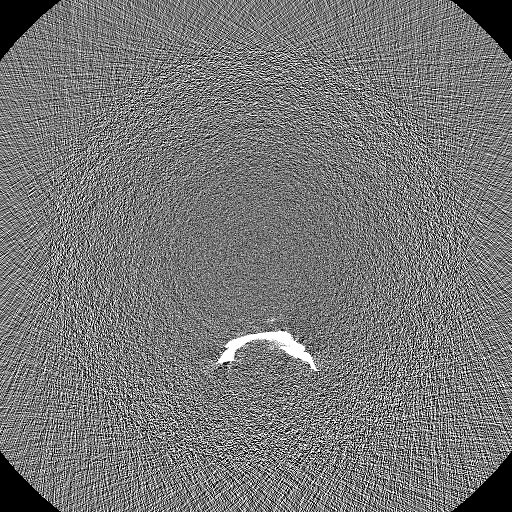
[im 88/437  soft-tissue]
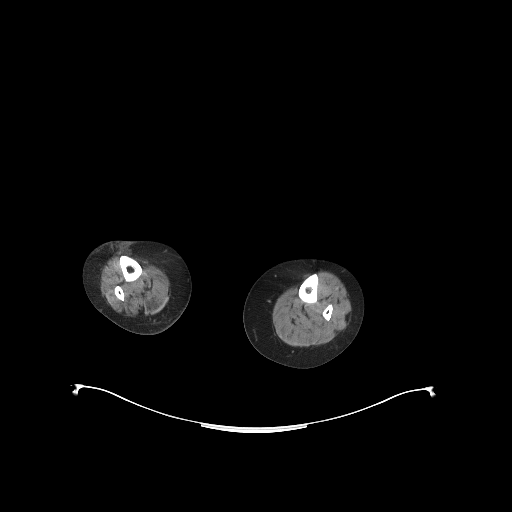
[im 175/437  soft-tissue]
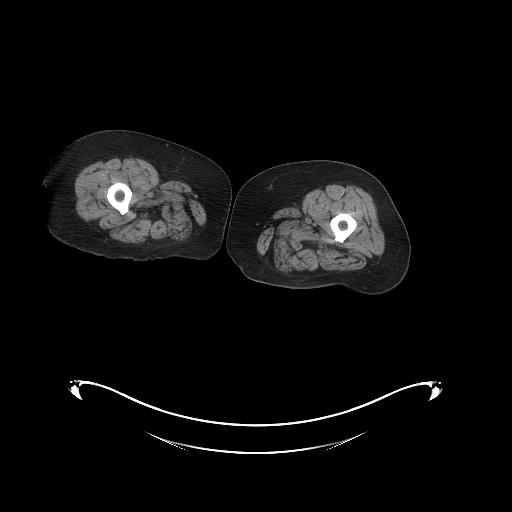
[im 262/437  soft-tissue]
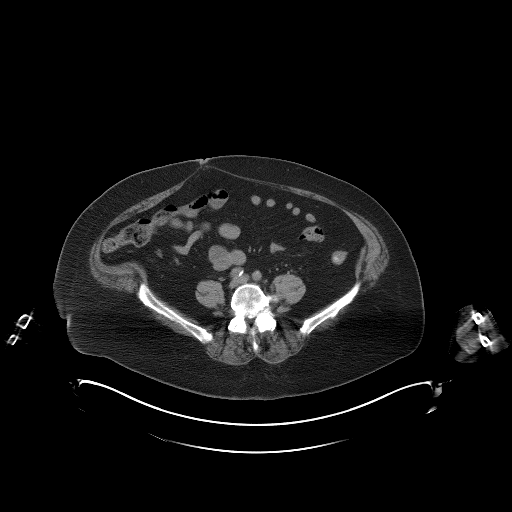
[im 349/437  soft-tissue]
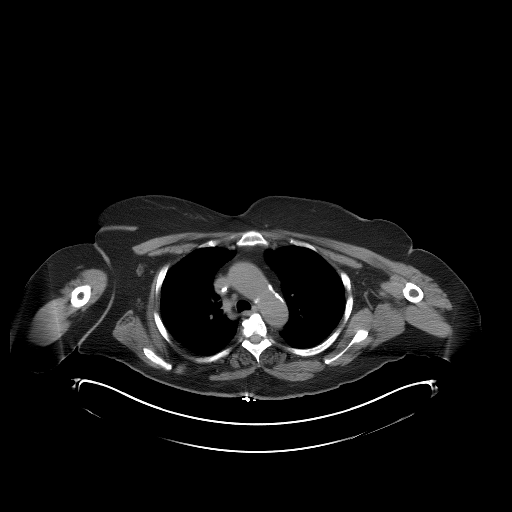
[im 437/437  soft-tissue]
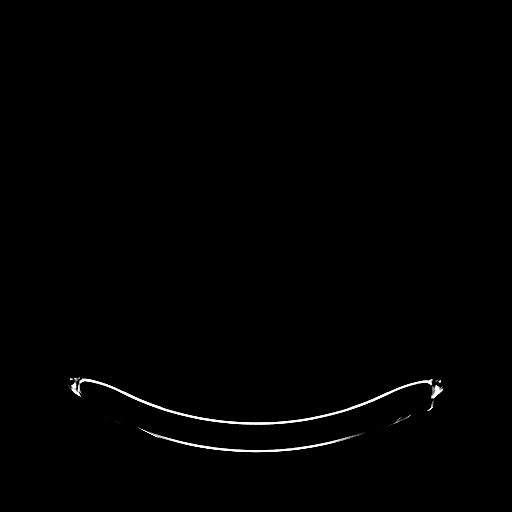

[Series 5: pet wb nac · axial · 5.0mm · 4.07mm/px · z∈[-324,+1440]mm · 6 of 442 slices shown]
[im 1/442]
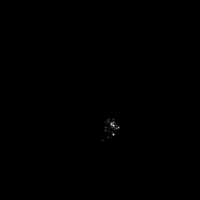
[im 89/442]
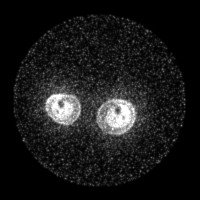
[im 177/442]
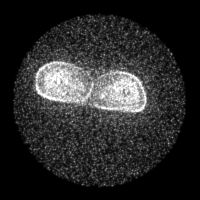
[im 265/442]
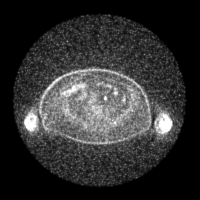
[im 353/442]
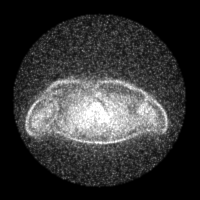
[im 442/442]
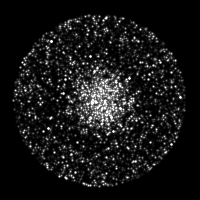

[Series 8: ct wb 5.0 b70f lung_bone · axial · 5.0mm · 0.69mm/px · 1 of 75 slices shown]
[im 1/75  lung]
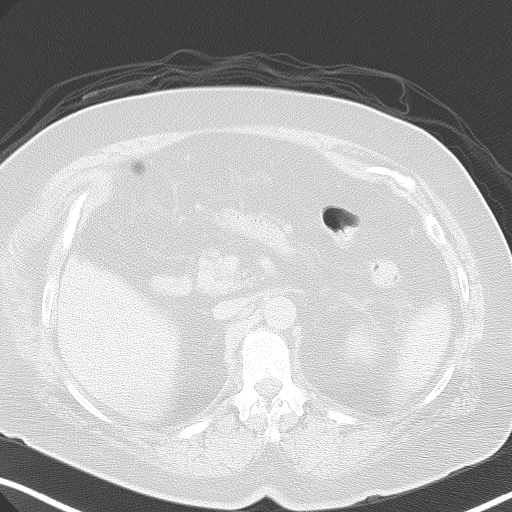

[Series 603: range-ct wb 5.0 hd_fov-cor-<alpha range> · 1 of 81 slices shown]
[im 1/81]
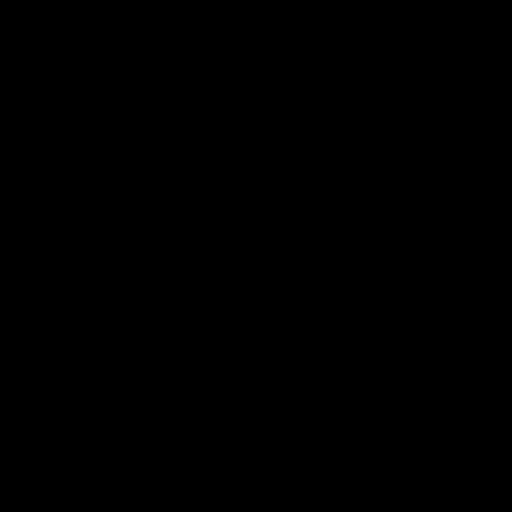

[Series 604: mip range 2 · coronal · 3.65mm/px · 1 of 32 slices shown]
[im 1/32]
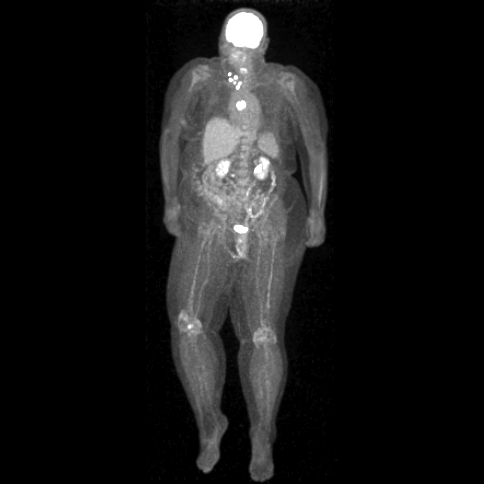

[Series 605: range-ct wb 5.0 hd_fov-tra-<alpha range> · 5 of 419 slices shown]
[im 1/419]
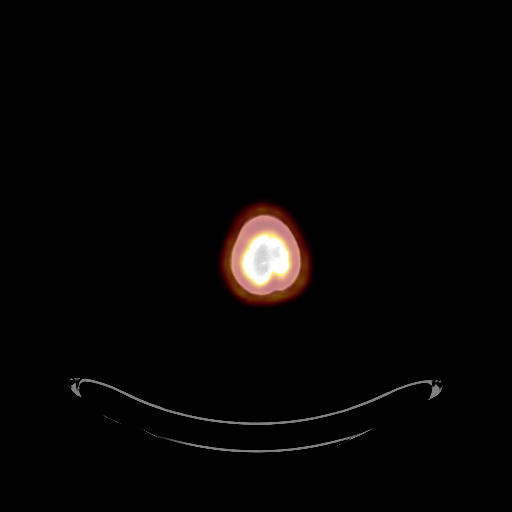
[im 105/419]
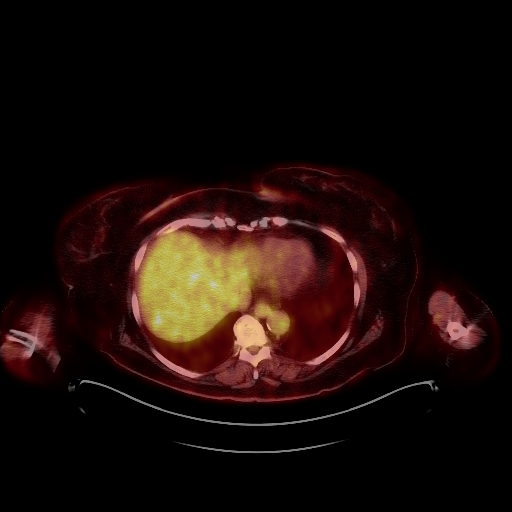
[im 210/419]
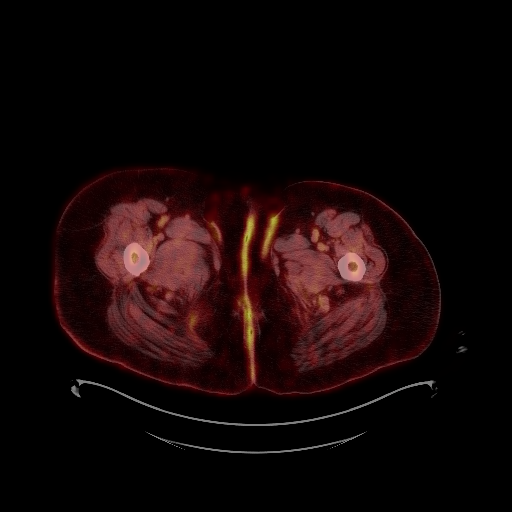
[im 314/419]
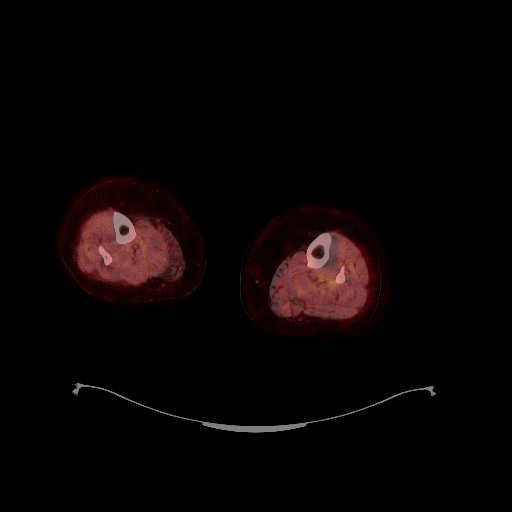
[im 419/419]
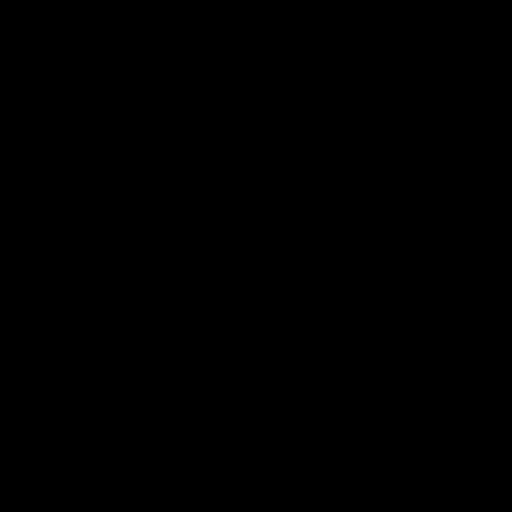

[25 of 25 positions shown; findings below may reference images not displayed]

FINDINGS: Mediastinal blood pool activity: SUV max

HEAD/NECK: Newly hypermetabolic right level IV lymph node 0.6 cm in
short axis on image 69/4, maximum SUV 34.9. Adjacent 0.5 cm right
level IV lymph node on image 71/4 has maximum SUV of 26.5.

Incidental CT findings: Intracranial chronic ischemic microvascular
white matter disease. Other bilateral internal jugular lymph nodes
are not appreciably hypermetabolic. Glottic activity is likely
physiologic.

CHEST: Hypermetabolic right supraclavicular, right upper
paratracheal, and subcarinal lymph nodes. Clustered subcarinal nodes
with the larger measuring 1.2 cm in short axis, maximum SUV 67.9. A
right upper paratracheal node measuring 0.7 cm in short axis on
image 81/4 has maximum SUV of 34.5, previously 0.5 cm with maximum
SUV 2.8.

Incidental CT findings: Right Port-A-Cath tip: Right atrium.
Atherosclerotic aortic arch and branch vessels.

ABDOMEN/PELVIS: A 1 cm focus of accentuated activity in the mid
transverse colon has maximum SUV of 8.8, and is not immediately
apparent on the prior exam, this could be physiologic given the
background bowel activity. There is similar foci of activity in the
descending and sigmoid colon without definite correlate on the CT
data.

Low-grade bilateral inguinal nodal activity is again identified,
right inguinal lymph node 0.9 cm in short axis on image 209/4
(formerly 1.0 cm) with maximum SUV 4.2 (previously 4.8) and index
left inguinal lymph node measuring 0.9 cm in short axis on image
211/4 (formerly 1.0 cm) with maximum SUV 3.3 (formerly 4.5).

Incidental CT findings: Aortoiliac atherosclerotic vascular disease.
Left adnexal cyst, photopenic.

SKELETON: Accentuated activity centrally in the right knee joint
along what may be a free osteochondral fragment, maximum SUV 10.8,
formerly 5.7 this intra-articular location would not be
characteristic of metastatic disease.

Incidental CT findings: Bilateral knee osteoarthritis.

EXTREMITIES: No significant abnormal hypermetabolic activity in this
region.

Incidental CT findings: none
IMPRESSION: 1. Small but highly hypermetabolic lymph nodes in the right lower
neck and chest compatible with active malignancy. Low-grade activity
in bilateral inguinal lymph nodes, indeterminate.
2. 1 cm focus of accentuated activity in the mid transverse colon,
not visible previously, probably physiologic, surveillance
suggested.
3. Other imaging findings of potential clinical significance: Aortic
Atherosclerosis ([id number]). Intracranial chronic ischemic
microvascular white matter disease. Left adnexal cyst without
abnormal activity. Bilateral knee osteoarthritis.

## 2020-04-25 IMAGING — US US BIOPSY LYMPH NODE
1 series · 13 of 17 positions shown · non-contrast
Comparison: none

INDICATION: History malignant melanoma with new hypermetabolic lymph nodes in
the right supraclavicular region by PET scan and the upper
mediastinum.

[Series 1: us biopsy lymph node · 13 of 17 slices shown]
[im 1/17]
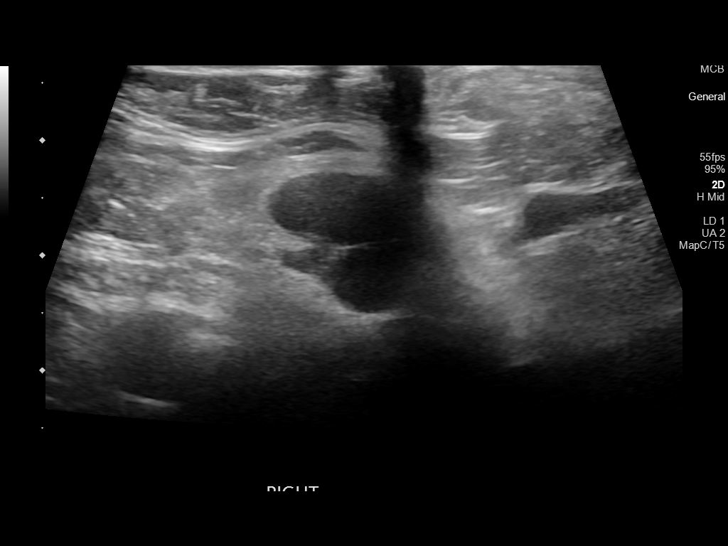
[im 2/17]
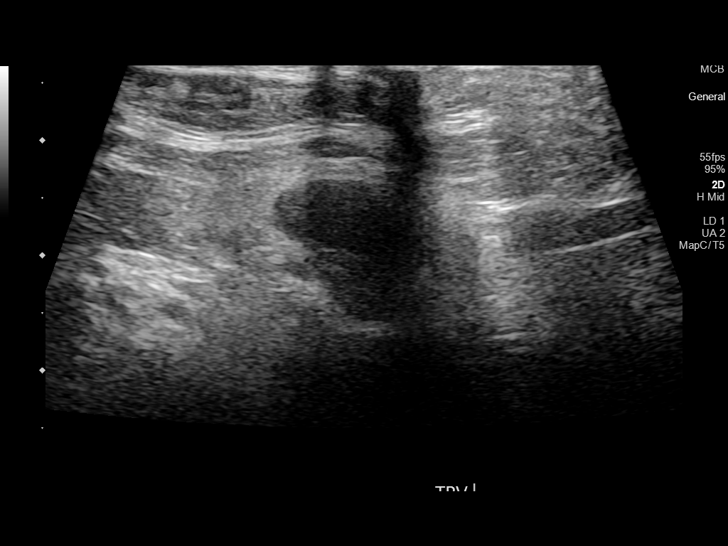
[im 4/17]
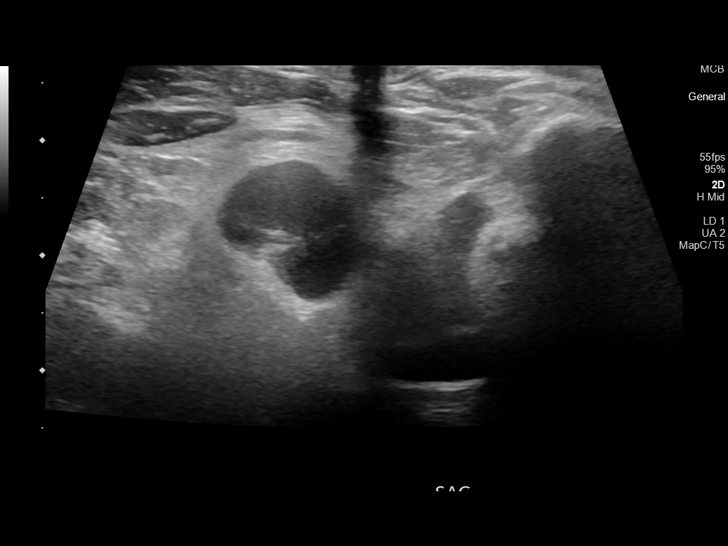
[im 5/17]
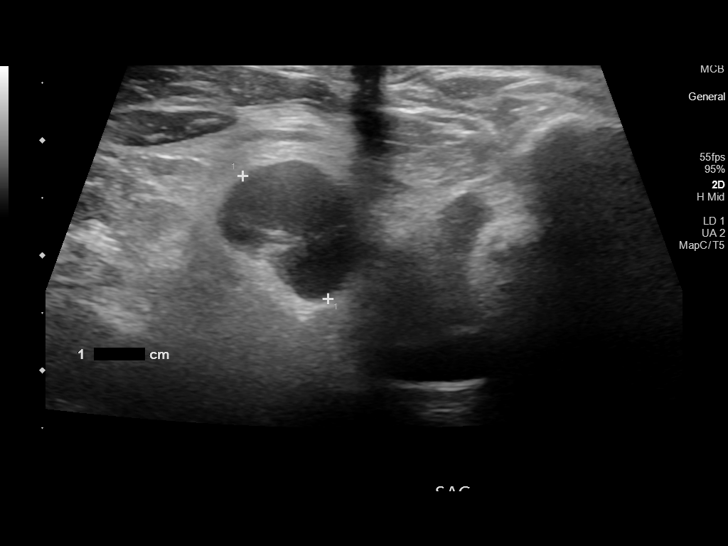
[im 6/17]
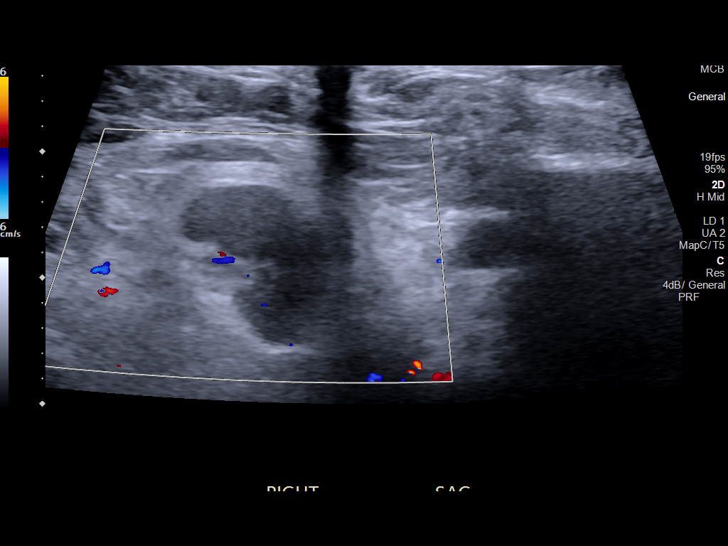
[im 8/17]
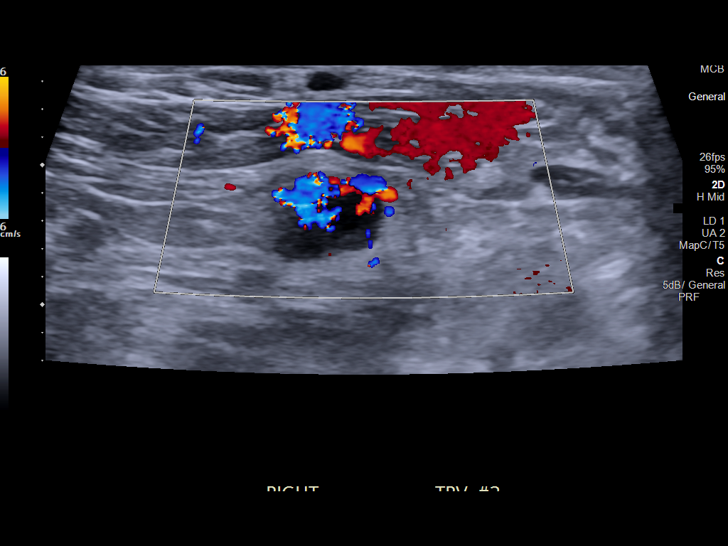
[im 9/17]
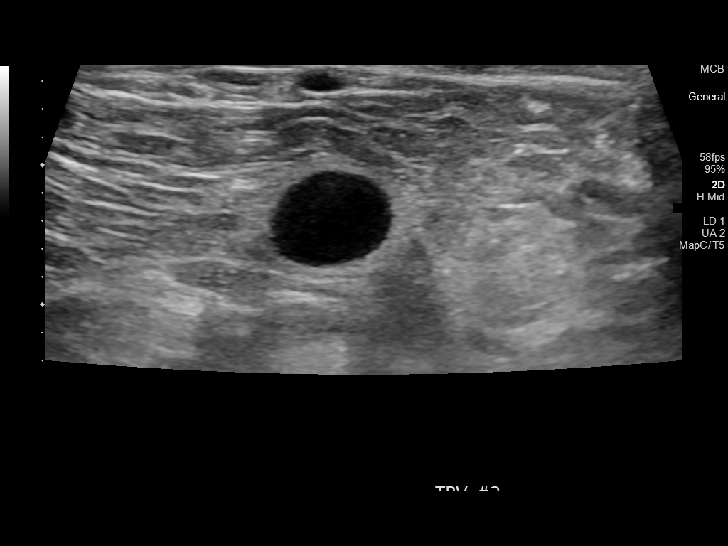
[im 10/17]
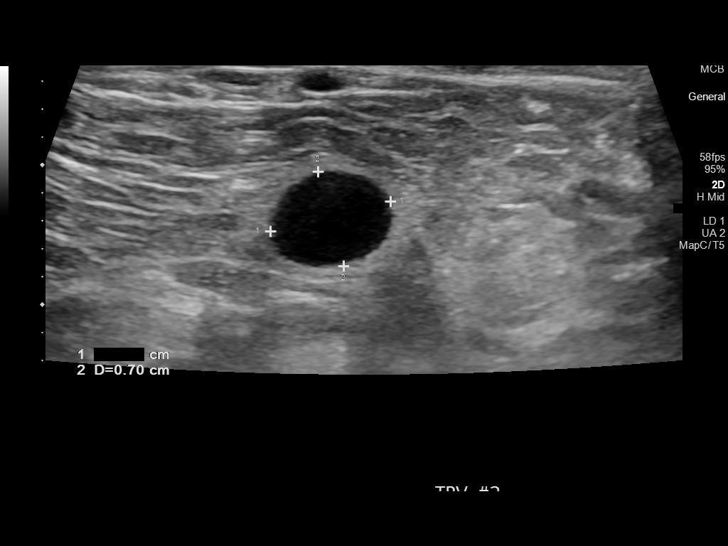
[im 12/17]
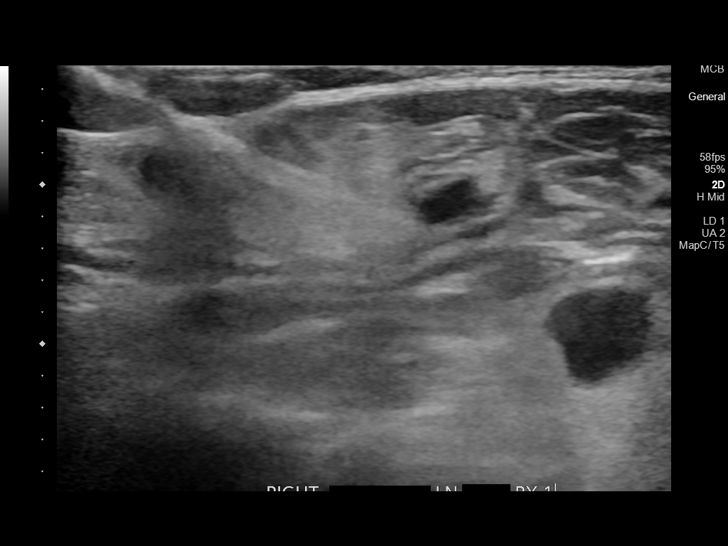
[im 13/17]
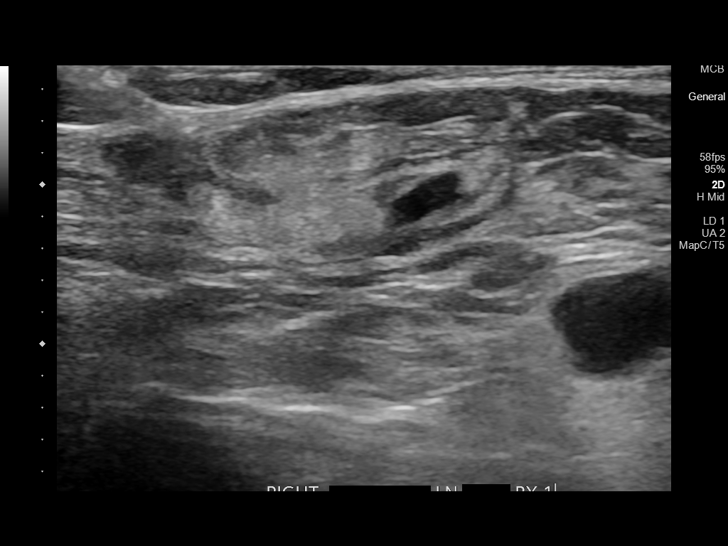
[im 14/17]
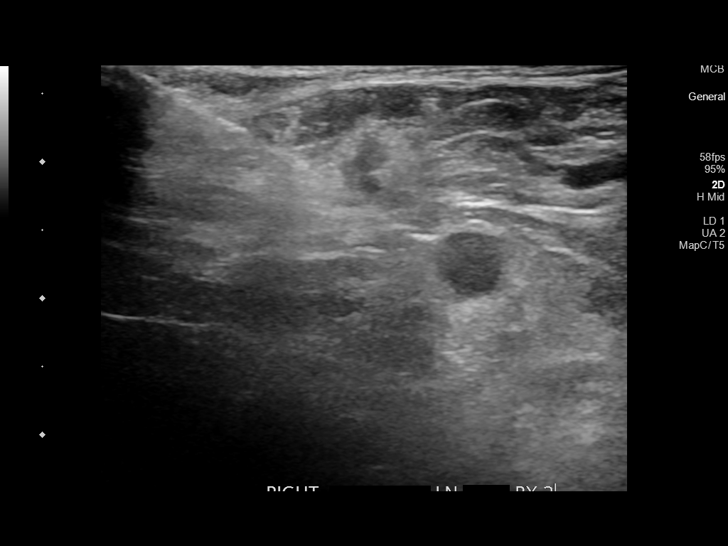
[im 16/17]
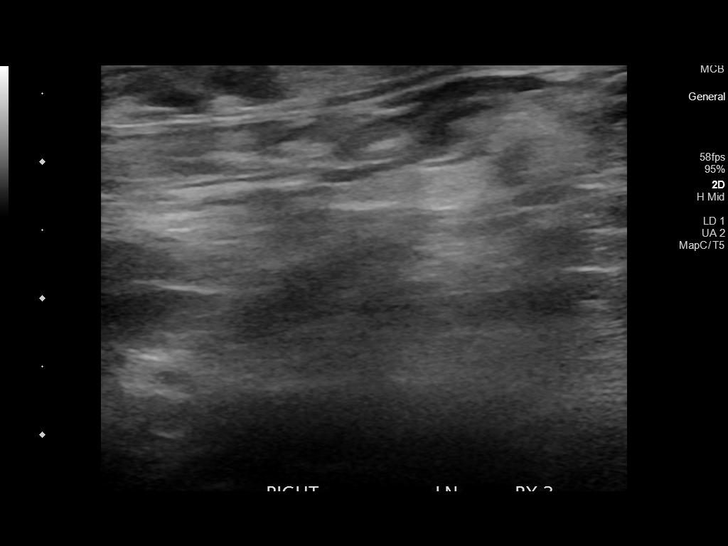
[im 17/17]
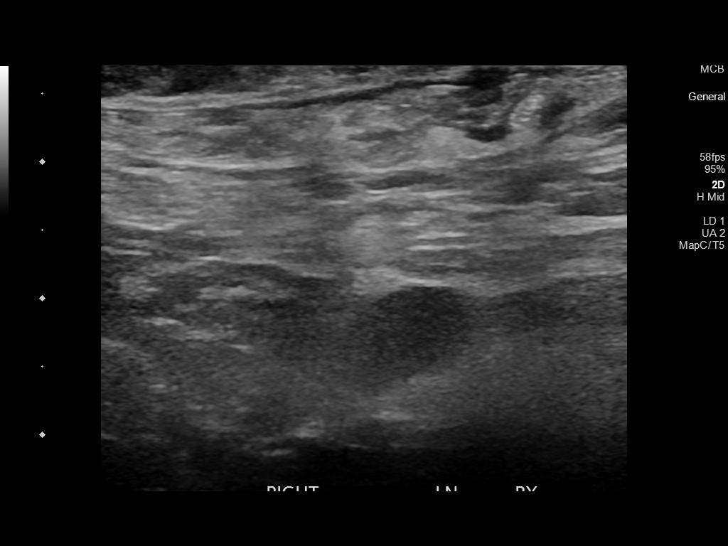

[13 of 17 positions shown; findings below may reference images not displayed]

EXAM:
ULTRASOUND GUIDED CORE BIOPSY OF RIGHT SUPRACLAVICULAR LYMPH NODE

MEDICATIONS:
None.

ANESTHESIA/SEDATION:
Fentanyl 50 mcg IV; Versed 2.0 mg IV

Moderate Sedation Time:  21 minutes.

The patient was continuously monitored during the procedure by the
interventional radiology nurse under my direct supervision.

PROCEDURE:
The procedure, risks, benefits, and alternatives were explained to
the patient. Questions regarding the procedure were encouraged and
answered. The patient understands and consents to the procedure. A
time-out was performed prior to initiating the procedure.

Ultrasound was performed in the right supraclavicular region. The
right lower neck was prepped with chlorhexidine in a sterile
fashion, and a sterile drape was applied covering the operative
field. A sterile gown and sterile gloves were used for the
procedure. Local anesthesia was provided with 1% Lidocaine.

Under ultrasound guidance, core biopsy samples were obtained of a
right supraclavicular lymph node with an 18 gauge core biopsy
device. Three separate core biopsy samples were obtained and
submitted in formalin. Additional ultrasound was performed.

COMPLICATIONS:
None immediate.
FINDINGS: Two separate lymph nodes were identified in the right
supraclavicular region and correspond to hypermetabolic lymph nodes
by PET scan. The more superior node measures 0.9 x 0.7 x 1.0 cm and
was sampled. A more inferior lymph node measures up to 1.3 cm in
length but lies immediately lateral to an indwelling port catheter
and it was felt that this could not be sampled without potential
risk of damage to the port catheter.

Biopsy of the 1 cm right supraclavicular lymph node was difficult as
the node was mobile in fat within the supraclavicular fossa.
Fragments of tissue were able to be obtained from the lymph node.
IMPRESSION: Ultrasound-guided core biopsy of 1 cm right supraclavicular lymph
node. Biopsy was technically difficult as the lymph node was fairly
mobile in the fat of the supraclavicular fossa. Fragmented core
biopsy samples were obtained.
